# Patient Record
Sex: Male | Born: 1970 | Race: White | Hispanic: No | Marital: Married | Smoking: Never smoker
Health system: Southern US, Community
[De-identification: ages and names within clinical notes are randomized; demographics above are authoritative.]

## PROBLEM LIST (undated history)

## (undated) DIAGNOSIS — I10 Essential (primary) hypertension: Secondary | ICD-10-CM

## (undated) DIAGNOSIS — E785 Hyperlipidemia, unspecified: Secondary | ICD-10-CM

## (undated) DIAGNOSIS — K219 Gastro-esophageal reflux disease without esophagitis: Secondary | ICD-10-CM

## (undated) HISTORY — DX: Essential (primary) hypertension: I10

## (undated) HISTORY — PX: NASAL SEPTUM SURGERY: SHX37

## (undated) HISTORY — PX: FRACTURE SURGERY: SHX138

## (undated) HISTORY — PX: TONSILECTOMY, ADENOIDECTOMY, BILATERAL MYRINGOTOMY AND TUBES: SHX2538

## (undated) HISTORY — DX: Hyperlipidemia, unspecified: E78.5

## (undated) HISTORY — DX: Gastro-esophageal reflux disease without esophagitis: K21.9

---

## 2001-02-23 ENCOUNTER — Ambulatory Visit (HOSPITAL_BASED_OUTPATIENT_CLINIC_OR_DEPARTMENT_OTHER): Admission: RE | Admit: 2001-02-23 | Discharge: 2001-02-23 | Payer: Self-pay | Admitting: Otolaryngology

## 2002-07-28 ENCOUNTER — Emergency Department (HOSPITAL_COMMUNITY): Admission: EM | Admit: 2002-07-28 | Discharge: 2002-07-28 | Payer: Self-pay | Admitting: *Deleted

## 2002-07-28 ENCOUNTER — Encounter: Payer: Self-pay | Admitting: *Deleted

## 2003-01-24 ENCOUNTER — Emergency Department (HOSPITAL_COMMUNITY): Admission: EM | Admit: 2003-01-24 | Discharge: 2003-01-24 | Payer: Self-pay | Admitting: Emergency Medicine

## 2003-01-24 ENCOUNTER — Encounter: Payer: Self-pay | Admitting: Emergency Medicine

## 2003-01-27 ENCOUNTER — Ambulatory Visit (HOSPITAL_BASED_OUTPATIENT_CLINIC_OR_DEPARTMENT_OTHER): Admission: RE | Admit: 2003-01-27 | Discharge: 2003-01-28 | Payer: Self-pay | Admitting: Orthopedic Surgery

## 2003-03-25 ENCOUNTER — Encounter (HOSPITAL_COMMUNITY): Admission: RE | Admit: 2003-03-25 | Discharge: 2003-04-24 | Payer: Self-pay | Admitting: Orthopedic Surgery

## 2003-04-05 ENCOUNTER — Encounter: Payer: Self-pay | Admitting: Family Medicine

## 2003-04-05 ENCOUNTER — Ambulatory Visit (HOSPITAL_COMMUNITY): Admission: RE | Admit: 2003-04-05 | Discharge: 2003-04-05 | Payer: Self-pay | Admitting: Family Medicine

## 2003-04-26 ENCOUNTER — Encounter (HOSPITAL_COMMUNITY): Admission: RE | Admit: 2003-04-26 | Discharge: 2003-05-26 | Payer: Self-pay | Admitting: Orthopedic Surgery

## 2003-05-26 ENCOUNTER — Encounter (HOSPITAL_COMMUNITY): Admission: RE | Admit: 2003-05-26 | Discharge: 2003-06-25 | Payer: Self-pay | Admitting: Orthopedic Surgery

## 2003-06-28 ENCOUNTER — Encounter (HOSPITAL_COMMUNITY): Admission: RE | Admit: 2003-06-28 | Discharge: 2003-07-28 | Payer: Self-pay | Admitting: Orthopedic Surgery

## 2003-11-02 ENCOUNTER — Ambulatory Visit (HOSPITAL_COMMUNITY): Admission: RE | Admit: 2003-11-02 | Discharge: 2003-11-02 | Payer: Self-pay | Admitting: Orthopedic Surgery

## 2003-11-02 ENCOUNTER — Ambulatory Visit (HOSPITAL_BASED_OUTPATIENT_CLINIC_OR_DEPARTMENT_OTHER): Admission: RE | Admit: 2003-11-02 | Discharge: 2003-11-02 | Payer: Self-pay | Admitting: Orthopedic Surgery

## 2003-11-09 ENCOUNTER — Encounter (HOSPITAL_COMMUNITY): Admission: RE | Admit: 2003-11-09 | Discharge: 2003-12-09 | Payer: Self-pay | Admitting: Orthopedic Surgery

## 2003-12-12 ENCOUNTER — Encounter (HOSPITAL_COMMUNITY): Admission: RE | Admit: 2003-12-12 | Discharge: 2004-01-11 | Payer: Self-pay | Admitting: Orthopedic Surgery

## 2004-01-11 ENCOUNTER — Encounter (HOSPITAL_COMMUNITY): Admission: RE | Admit: 2004-01-11 | Discharge: 2004-02-10 | Payer: Self-pay | Admitting: Orthopedic Surgery

## 2007-06-12 ENCOUNTER — Observation Stay (HOSPITAL_COMMUNITY): Admission: RE | Admit: 2007-06-12 | Discharge: 2007-06-13 | Payer: Self-pay | Admitting: Otolaryngology

## 2007-06-12 ENCOUNTER — Encounter (INDEPENDENT_AMBULATORY_CARE_PROVIDER_SITE_OTHER): Payer: Self-pay | Admitting: Otolaryngology

## 2007-08-18 ENCOUNTER — Ambulatory Visit (HOSPITAL_BASED_OUTPATIENT_CLINIC_OR_DEPARTMENT_OTHER): Admission: RE | Admit: 2007-08-18 | Discharge: 2007-08-18 | Payer: Self-pay | Admitting: Otolaryngology

## 2007-08-22 ENCOUNTER — Ambulatory Visit: Payer: Self-pay | Admitting: Internal Medicine

## 2010-01-24 ENCOUNTER — Encounter: Admission: RE | Admit: 2010-01-24 | Discharge: 2010-01-24 | Payer: Self-pay | Admitting: Occupational Medicine

## 2010-11-06 NOTE — Procedures (Signed)
[name], Lawrence Hull NO.:  [id number]   MEDICAL RECORD NO.:  [id number]          PATIENT TYPE:  OUT   LOCATION:  SLEEP CENTER                 FACILITY:  [facility]   PHYSICIAN:  Clinton D. Maple Hudson, MD, FCCP, FACPDATE OF BIRTH:  02/25/1971   DATE OF STUDY:  08/18/2007                            NOCTURNAL POLYSOMNOGRAM   REFERRING PHYSICIAN:  Onalee Hua L. Annalee Genta, M.D.   INDICATION FOR STUDY:  Hypersomnia with sleep apnea.   EPWORTH SLEEPINESS SCORE:  4/24, BMI 42, weight 286 pounds, height 69  inches, neck 17 inches.   MEDICATIONS:  Home medication charted and reviewed.   SLEEP ARCHITECTURE:  Total sleep time 376 minutes with sleep efficiency  91.2%.  Stage I was 4.5%, stage II 74.6%, stage absent, REM 20.8% of  total sleep time.  Sleep latency 19 minutes REM latency 50 minutes,  awake after sleep onset 17.5 minutes, arousal index 22.  Bedtime  medication included Ambien 5 mg, Actos, Prilosec, Vytorin, atenolol.   RESPIRATORY DATA:  Apnea-hypopnea index (AHI) 3.7/hr which is normal.  Respiratory disturbance index (RDI) 5.1/hr, which would borderline  abnormal, with upper limit of normal 5/hr.  There were 23 events scored,  including 1 obstructive apnea and 22 hypopneas.  All events were  recorded while lying supine.  REM AHI 14.5.  There were insufficient  events to qualify for CPAP titration by split protocol on this study  night.   OXYGEN DATA:  No snoring.  Oxygen desaturation transiently to a nadir of  86% with mean oxygen saturation through the study 96.5% on room air.   CARDIAC DATA:  Sinus rhythm with occasional PVCs.   MOVEMENT-PARASOMNIA:  No significant movement disturbance, no bathroom  trips.   IMPRESSIONS-RECOMMENDATIONS:  Occasional respiratory events (apnea-  hypopnea index 3.7 per hour), within normal limits (normal range 0 to 5  per hour).  Events were mainly associated with supine sleep position.  Snoring was not noted.  Oxygen saturation  dipped transiently to 86% with  a mean of 96.5% through the study on room air.      Clinton D. Maple Hudson, MD, FCCP, FACP  Diplomate, Biomedical engineer of Sleep Medicine  Electronically Signed     CDY/MEDQ  D:  08/22/2007 15:52:14  T:  08/23/2007 17:20:41  Job:  [id number]

## 2010-11-06 NOTE — Op Note (Signed)
NAMEDESMEN, SCHOFFSTALL            ACCOUNT NO.:  [id number]   MEDICAL RECORD NO.:  [id number]          PATIENT TYPE:  AMB   LOCATION:  SDS                          FACILITY:  [facility]   PHYSICIAN:  Kinnie Scales. Annalee Genta, M.D.DATE OF BIRTH:  1971-04-12   DATE OF PROCEDURE:  06/12/2007  DATE OF DISCHARGE:                               OPERATIVE REPORT   PREOPERATIVE DIAGNOSES/INDICATIONS FOR SURGERY:  1. Severe obstructive sleep apnea.  2. Nasal septal deviation with airway obstruction.  3. Tonsillar hypertrophy.  4. Uvula palatal hypertrophy.  5. Inferior turbinate hypertrophy.   POSTOPERATIVE DIAGNOSES:  1. Severe obstructive sleep apnea.  2. Nasal septal deviation with airway obstruction.  3. Tonsillar hypertrophy.  4. Uvula palatal hypertrophy.  5. Inferior turbinate hypertrophy.   SURGICAL PROCEDURE:  1. Uvulopalatopharyngoplasty.  2. Nasal septoplasty.  3. Vitreous tonsillectomy.  4. Inferior turbinate reduction.   SURGEON:  Dr. Annalee Genta.   ANESTHESIA:  General endotracheal.   COMPLICATIONS:  None.   BLOOD LOSS:  Less than 100 mL.   The patient transferred from the operating room to recovery room in  stable condition.   BRIEF HISTORY:  The patient is a 40 year old white male who was referred  for evaluation of severe obstructive sleep apnea.  The patient was  evaluated in the office and found to have a severely deviated septum  with left-sided nasal airway obstruction, significant nasal turbinate  hypertrophy.  Oral cavity and oropharynx showed tonsillar hypertrophy  with significant thickening of the palate, uvula and posterior tonsillar  pillars.  Given the patient's history and examination, we discussed  various treatment options.  He had been using CPAP with limited  effectiveness because of nasal airway congestion and pressure symptoms  and was having difficulty tolerating CPAP on a long-term basis, and  thus, we discussed surgical treatment consisting of  septoplasty,  turbinate reduction, tonsillectomy, uvulopalatopharyngoplasty to improve  the patient's airway.  Risk, benefits and possible complications of the  surgical procedure were discussed in detail with the patient and his  wife, and they understood and concurred with our plan for surgery which  was scheduled under general anesthesia as an outpatient on June 12, 2007.   PROCEDURE:  The patient was brought to the operating room at [facility] OR and placed in a supine position on the operating table.  General endotracheal anesthesia was established without difficulty.  When the patient was adequately anesthetized, he was positioned on the  operating table and prepped and draped in a sterile fashion.  The  patient's nasal cavity was injected with 1% lidocaine 1:100,000 solution  epinephrine and was injected in a submucosal fashion on the nasal septum  and inferior turbinates bilaterally.  Total of 8 mL was injected.  The  patient's nose was then packed with Afrin-soaked cottonoid pledgets  which was left in place for approximately 10 minutes to allow for  vasoconstriction and hemostasis.   The patient's surgical procedure was begun with nasal septoplasty.  A  left anterior hemitransfixion incision was created, and a  mucoperichondrial flap was elevated from anterior to posterior on the  patient's  left-hand side.  Bony cartilaginous junction was crossed in  the midline, and mucoperiosteal flap was elevated on the right.  With  the bone cartilage exposed, significant septal spurring and bony excess  was removed using a 4-mm osteotome.  Anterior, dorsal and columellar  cartilage was not deviated, and this was left intact.  Mid septal  cartilage was removed, morselized and returned to the mucoperichondrial  pocket at the conclusion of the surgical procedure.  The  mucoperichondrial flaps were then reapproximated with a 4-0 gut suture  on a Keith needle in horizontal  mattress fashion.  At the end of the  surgical procedure, bilateral nasal septal splints were placed without  difficulty after the application of Bactroban ointment and sutured into  position with a 3-0 Ethilon suture.   Inferior turbinate reduction was then performed with bipolar cautery set  at 12 watts.  Two submucosal passes were made in each inferior  turbinate, when the turbinates had been adequately cauterized and  outfractured to create a more patent nasal cavity, small anterior  incisions were created in each turbinate and a small turbinate bone was  resected after mobilizing the overlying mucosa.  The patient's nasal  cavity was then suction and irrigated.  Turbinates were outfractured to  create maximal patency, and there was minimal bleeding.   Attention was then turned the oral cavity and oropharynx.  A Crowe-Davis  mouth gag was inserted without difficulty.  No loose or broken teeth.  Hard and soft palate were intact.  The patient had significant tonsillar  hypertrophy with bilateral cryptic tonsillar changes.  Using Bovie  electrocautery, the tonsils were removed, dissected in subcapsular  fashion, the entire left tonsil was removed from superior pole of the  base right.  Tonsil removed in a similar fashion, and tonsil tissue was  sent to pathology for gross and microscopic evaluation.  Tonsillar fossa  were gently abraded with a dry tonsil sponge, and several small areas of  point hemorrhage were then cauterized with suction cautery.  There was  no further bleeding.  Attention then was turned to the palate, and a  uvulopalatopharyngoplasty was then performed, using Bovie electrocautery  and dissecting along the anterior aspect of the palate at the level of  the anterior tonsillar pillars, mucosa was divided, and deep submucosa  and muscular layer was then dissected.  Approximately 1-cm strip of  tissue including the uvula was resected, and dissection was then carried   posteriorly, preserving the posterior palatal mucosa for reconstruction.  With palatal tissue resected, reconstruction was then undertaken using a  4-0 Vicryl suture on a tapered needle.  Horizontal mattress sutures were  placed, advanced posterior tonsillar pillars anteriorly and closed the  tonsillar fossa.  This was done bilaterally to reconstruct the palate,  and posterior palatal mucosa was then reflected anteriorly along the mid  aspect of the palate, and reconstruction was completed with interrupted  4-0 Vicryl suture.   The patient's oral cavity and oropharynx were then irrigated and  suctioned.  Orogastric tube was passed.  Stomach contents were  aspirated.  The mouth gag was released and reapplied.  There was no  active bleeding.  Mouth gag was then removed.  The patient was awakened  from his anesthetic.  He was extubated and was then transferred from the  operating room to the recovery in stable condition.  No complications.  Blood loss less than 100 mL.           ______________________________  Onalee Hua  Donnal Moat, M.D.     DLS/MEDQ  D:  16/03/9603  T:  06/13/2007  Job:  [id number]

## 2010-11-09 ENCOUNTER — Other Ambulatory Visit (INDEPENDENT_AMBULATORY_CARE_PROVIDER_SITE_OTHER): Payer: Self-pay | Admitting: Surgery

## 2010-11-09 NOTE — Op Note (Signed)
Lawrence Hull, Lawrence Hull                      ACCOUNT NO.:  [id number]   MEDICAL RECORD NO.:  [id number]                   PATIENT TYPE:  AMB   LOCATION:  DSC                                  FACILITY:  [facility]   PHYSICIAN:  Loreta Ave, M.D.              DATE OF BIRTH:  December 15, 1970   DATE OF PROCEDURE:  11/02/2003  DATE OF DISCHARGE:                                 OPERATIVE REPORT   PREOPERATIVE DIAGNOSES:  1. Status post fracture dislocation of left ankle.  2. Status post removal of syndesmosis screw.  3. Painful retained medial malleolar screws.  4. Synovitis of the ankle.   POSTOPERATIVE DIAGNOSES:  1. Status post fracture dislocation of left ankle.  2. Status post removal of syndesmosis body screw.  3. Painful retained medial malleolar screws.  4. Synovitis of the ankle.  5. Intra-articular synovitis as well as some focal grade 3 chondromalacia,     lateral talar dome.  6. Well healed fractures.   PROCEDURES:  1. Left ankle examination under anesthesia.  2. Removal of medial malleolar screws times two.  3. Arthroscopy with chondroplasty of talus.  4. Partial synovectomy.  5. Removal of anterior tibial spurs.   SURGEON:  Loreta Ave, M.D.   ASSISTANT:  Arlys John D. Petrarca, P.A.-C.   ANESTHESIA:  General.   ESTIMATED BLOOD LOSS:  Minimal.   TOURNIQUET TIME:  One hour.   SPECIMENS:  None.   CULTURES:  None.   COMPLICATIONS:  None.   DRESSING:  Soft compressive.   DESCRIPTION OF PROCEDURE:  The patient was brought to the operating room and  after adequate anesthesia had been obtained the left ankle was examined.  Good stability.  Reasonable plantar flexion.  Dorsiflexion only about 10  degrees without real bony impingement; just stiffness around the ankle.  The  syndesmosis was well reduced.  Tourniquet was applied, stirrup applied to  hold the leg, prepped and draped in the usual sterile fashion.  Exsanguinated with elevation of the Esmarch and the  tourniquet was inflated  to 350 mmHg.  Medial incision was opened.  The deltoid ligament was exposed.  It was split longitudinally over the two screws which were identified and  then backed out without difficulty.  Wound irrigated and the ligament closed  with Vicryl. The wound was closed with nylon.  Anterolateral and  anteromedial ankle portals avoiding neurovascular structures.  The  arthroscope was introduced, the ankle distended and the ankle inspected.  Moderate to marked amount of synovitis anteriorly all removed.  Although it  was not bony impingement, there was a fairly significant anterior tibial  spur that was completely removed and tapered smoothly with a shaver and a  high speed bur.  Ankle was thoroughly inspected.  After all adhesions and  the synovitis were removed, I got a good inspection of the ankle.  The  medial side was well healed and there were no significant changes there.  However, on the lateral talus under the syndesmosis there was some focal  grade 3 lesions with chondral flaps and fissuring.  Chondroplasty to a  stable surface.  Although thinned in that area there was still reasonable  articular cartilage remaining.  The under surface of the tibia, the  syndesmosis and both the medial and lateral gutters all otherwise looked  intact.  At completion I had a little bit of improved dorsiflexion  maintaining a stable ankle and I felt like I got a good debridement of the  ankle itself.  The instruments and fluid were removed.  The ankle portals  were injected with Marcaine and the portals were closed with nylon. A  sterile compressive dressing was applied.  The tourniquet was deflated and  removed. Anesthesia was reversed.  The patient was brought to the recovery  room.  He tolerated the surgery well with no complications.                                               Loreta Ave, M.D.    DFM/MEDQ  D:  11/03/2003  T:  11/03/2003  Job:  [id number]

## 2010-11-09 NOTE — Op Note (Signed)
NAMELOU, LOEWE                      ACCOUNT NO.:  [id number]   MEDICAL RECORD NO.:  [id number]                   PATIENT TYPE:  AMB   LOCATION:  DSC                                  FACILITY:  [facility]   PHYSICIAN:  Loreta Ave, M.D.              DATE OF BIRTH:  Jun 29, 1970   DATE OF PROCEDURE:  01/27/2003  DATE OF DISCHARGE:                                 OPERATIVE REPORT   PREOPERATIVE DIAGNOSIS:  Markedly displaced medial malleolus fracture with  syndesmotic injury, left ankle.   POSTOPERATIVE DIAGNOSIS:  Markedly displaced medial malleolus fracture with  syndesmotic injury, left ankle.   PROCEDURES:  1. Left ankle open reduction and internal fixation of medial malleolus     fracture with cannulated 4.0 screws x2.  2. Examination and then repair of syndesmosis with syndesmotic cannulated     4.0 screw.   All of these Synthes screws.   SURGEON:  Loreta Ave, M.D.   ASSISTANT:  Arlys John D. Petrarca, P.A.-C.   ANESTHESIA:  General.   ESTIMATED BLOOD LOSS:  Minimal.   TOURNIQUET TIME:  40 minutes.   SPECIMENS:  None.   CULTURES:  None.   COMPLICATIONS:  None.   DRESSING:  Soft compressive with bulky dressing and splint.   DESCRIPTION OF PROCEDURE:  Patient brought to the operating room and placed  on the operating table in supine position.  After adequate had been  obtained, both ankles examined.  Obviously significantly displaced medial  malleolus fracture on the left with instability.  Also the syndesmosis open  to external rotation view on the left compared to normal views on the right.  Tourniquet applied to the upper aspect of the left leg.  Prepped and draped  in the usual sterile fashion.  Exsanguinated with elevation and Esmarch,  tourniquet inflated to 350 mmHg.  Longitudinal slightly curved incision over  the medial malleolus.  Subperiosteal exposure of the fracture.  The ankle  itself was drained.  Oblique fracture through the medial  malleolus with a  little bit of comminution.  After thoroughly debriding the area, it was able  to be reduced anatomically.  Fixed with two cannulated 4.0 screws, which  were pre-drilled and tapped over guidewires with adequate positioning with  fluoroscopic guidance.  Excellent stable fixation with anatomic alignment.  Once that was completed, I re-examined the ankle with external rotation and  stress, and there continued to be significant instability of the  syndesmosis.  Therefore fixed the syndesmosis with a longitudinal incision  over the fibula at the level of the syndesmosis.  A guidewire placed across  the syndesmosis, measured, and then pre-drilled.  It was then fixed with a  44 mm partially-threaded lag screw through the fibula and into the tibia.  The foot was held dorsiflexed while the screw was tightened.  Nice, secure  fixation into anatomic position.  At completion I had solid fixation of all  fractures  and ligamentous injury.  Full passive motion.  No gapping at the  syndesmosis with stress.  The wounds were irrigated, closed with Vicryl and  staples.  Margins of the wound injected with Marcaine.  A sterile  compressive dressing and short-leg splint applied.  Anesthesia reversed.  Brought to the recovery room.  Tolerated the surgery well with no  complications.                                                Loreta Ave, M.D.    DFM/MEDQ  D:  01/27/2003  T:  01/28/2003  Job:  [id number]

## 2010-11-14 ENCOUNTER — Ambulatory Visit (HOSPITAL_COMMUNITY)
Admission: RE | Admit: 2010-11-14 | Discharge: 2010-11-14 | Disposition: A | Discharge status: Home / Self Care | Payer: Private Health Insurance - Indemnity | Source: Ambulatory Visit | Attending: Surgery | Admitting: Surgery

## 2010-11-14 DIAGNOSIS — E119 Type 2 diabetes mellitus without complications: Secondary | ICD-10-CM | POA: Insufficient documentation

## 2010-11-14 DIAGNOSIS — Z6841 Body Mass Index (BMI) 40.0 and over, adult: Secondary | ICD-10-CM | POA: Insufficient documentation

## 2010-11-14 DIAGNOSIS — G4733 Obstructive sleep apnea (adult) (pediatric): Secondary | ICD-10-CM | POA: Insufficient documentation

## 2010-11-14 DIAGNOSIS — E7800 Pure hypercholesterolemia, unspecified: Secondary | ICD-10-CM | POA: Insufficient documentation

## 2010-11-14 DIAGNOSIS — K7689 Other specified diseases of liver: Secondary | ICD-10-CM | POA: Insufficient documentation

## 2010-11-14 DIAGNOSIS — I10 Essential (primary) hypertension: Secondary | ICD-10-CM | POA: Insufficient documentation

## 2010-11-14 DIAGNOSIS — K219 Gastro-esophageal reflux disease without esophagitis: Secondary | ICD-10-CM | POA: Insufficient documentation

## 2010-11-26 ENCOUNTER — Ambulatory Visit (HOSPITAL_COMMUNITY)
Admission: RE | Admit: 2010-11-26 | Discharge: 2010-11-26 | Disposition: A | Discharge status: Home / Self Care | Payer: Managed Care, Other (non HMO) | Source: Ambulatory Visit | Attending: Surgery | Admitting: Surgery

## 2010-11-26 DIAGNOSIS — Z6841 Body Mass Index (BMI) 40.0 and over, adult: Secondary | ICD-10-CM | POA: Insufficient documentation

## 2010-11-26 DIAGNOSIS — Z01818 Encounter for other preprocedural examination: Secondary | ICD-10-CM | POA: Insufficient documentation

## 2010-12-11 ENCOUNTER — Encounter: Payer: Managed Care, Other (non HMO) | Attending: Surgery | Admitting: *Deleted

## 2010-12-11 DIAGNOSIS — Z713 Dietary counseling and surveillance: Secondary | ICD-10-CM | POA: Insufficient documentation

## 2010-12-11 DIAGNOSIS — Z01818 Encounter for other preprocedural examination: Secondary | ICD-10-CM | POA: Insufficient documentation

## 2011-01-10 ENCOUNTER — Ambulatory Visit: Payer: Managed Care, Other (non HMO)

## 2011-01-10 ENCOUNTER — Encounter: Payer: Managed Care, Other (non HMO) | Attending: Surgery | Admitting: *Deleted

## 2011-01-10 ENCOUNTER — Encounter: Payer: Self-pay | Admitting: *Deleted

## 2011-01-10 DIAGNOSIS — Z713 Dietary counseling and surveillance: Secondary | ICD-10-CM | POA: Insufficient documentation

## 2011-01-10 DIAGNOSIS — Z01818 Encounter for other preprocedural examination: Secondary | ICD-10-CM | POA: Insufficient documentation

## 2011-01-10 NOTE — Progress Notes (Signed)
  Pre-Operative Nutrition Class  Patient was seen on 01/10/11 for Pre-Operative Nutrition education at [facility].   Surgery date: 01/29/11 @ 12:15pm Surgery type: LAGB  Weight today: 294.2lb Weight change: 3.8 lbs lost Total weight lost: 3.8lbs total BMI: 45%  Samples given per MNT protocol: Bariatric Advantage Multivitamin Lot # [id number] Exp: 9/13  Bariatric Advantage Calcium Citrate Lot # [id number] Exp: 10/13  Celebrate Vitamins Multivitamin Lot # [id number] Exp: 4/13  Celebrate VitaminsCalcium Citrate Lot #[id number]  Exp: 4/13  The following the learning objective met by the patient during this course:   Identifies Pre-Op Dietary Goals and will begin 2 weeks pre-operatively   Identifies appropriate sources of fluids and proteins   States protein recommendations and appropriate sources pre and post-operatively  Identifies Post-Operative Dietary Goals and will follow for 2 weeks post-operatively  Identifies appropriate multivitamin and calcium sources post-operatively  Describes the need for physical activity post-operatively and will follow MD recommendations  States when to call healthcare provider regarding medication questions or post-operative complications  Follow-Up Plan: Patient will follow-up at [facility] 2 weeks post operatively for diet advancement per MD.

## 2011-01-22 ENCOUNTER — Other Ambulatory Visit (INDEPENDENT_AMBULATORY_CARE_PROVIDER_SITE_OTHER): Payer: Self-pay | Admitting: Surgery

## 2011-01-22 ENCOUNTER — Encounter (HOSPITAL_COMMUNITY): Payer: Managed Care, Other (non HMO)

## 2011-01-22 LAB — COMPREHENSIVE METABOLIC PANEL WITH GFR
ALT: 27 U/L (ref 0–53)
AST: 20 U/L (ref 0–37)
Albumin: 4.6 g/dL (ref 3.5–5.2)
Alkaline Phosphatase: 80 U/L (ref 39–117)
BUN: 25 mg/dL — ABNORMAL HIGH (ref 6–23)
CO2: 27 meq/L (ref 19–32)
Calcium: 10.2 mg/dL (ref 8.4–10.5)
Chloride: 94 meq/L — ABNORMAL LOW (ref 96–112)
Creatinine, Ser: 1.23 mg/dL (ref 0.50–1.35)
GFR calc Af Amer: >60 mL/min
GFR calc non Af Amer: >60 mL/min
Glucose, Bld: 103 mg/dL — ABNORMAL HIGH (ref 70–99)
Potassium: 4.4 meq/L (ref 3.5–5.1)
Sodium: 130 meq/L — ABNORMAL LOW (ref 135–145)
Total Bilirubin: 0.5 mg/dL (ref 0.3–1.2)
Total Protein: 7.6 g/dL (ref 6.0–8.3)

## 2011-01-22 LAB — CBC
Hemoglobin: 14.8 g/dL (ref 13.0–17.0)
MCHC: 33.9 g/dL (ref 30.0–36.0)
RDW: 12.5 % (ref 11.5–15.5)
WBC: 8.1 10*3/uL (ref 4.0–10.5)

## 2011-01-22 LAB — DIFFERENTIAL
Basophils Absolute: 0 10*3/uL (ref 0.0–0.1)
Basophils Relative: 0 % (ref 0–1)
Eosinophils Relative: 1 % (ref 0–5)
Lymphocytes Relative: 40 % (ref 12–46)
Monocytes Absolute: 0.9 10*3/uL (ref 0.1–1.0)
Neutro Abs: 3.9 10*3/uL (ref 1.7–7.7)

## 2011-01-22 LAB — SURGICAL PCR SCREEN
MRSA, PCR: NEGATIVE
Staphylococcus aureus: POSITIVE — AB

## 2011-01-23 ENCOUNTER — Encounter (INDEPENDENT_AMBULATORY_CARE_PROVIDER_SITE_OTHER): Payer: Self-pay | Admitting: General Surgery

## 2011-01-25 ENCOUNTER — Ambulatory Visit (INDEPENDENT_AMBULATORY_CARE_PROVIDER_SITE_OTHER): Payer: Managed Care, Other (non HMO) | Admitting: Surgery

## 2011-01-25 ENCOUNTER — Encounter (INDEPENDENT_AMBULATORY_CARE_PROVIDER_SITE_OTHER): Payer: Self-pay | Admitting: Surgery

## 2011-01-25 VITALS — BP 128/72 | HR 64 | Temp 97.6°F | Resp 16 | Ht 68.0 in | Wt 284.4 lb

## 2011-01-25 NOTE — Progress Notes (Signed)
Lawrence Hull and his wife Lawrence Hull on whom I have done a LAP-BAND come in today for a preop visit. Today's BMI is 43, weight is 284, height is 5 feet 8. He had an upper GI series that did show a small sliding hiatal hernia and he does have GER. I told him we would try to fix his hiatal hernia at the same time Lawrence Hull has some symptoms that she may be too tight. At times she will have nocturnal reflux.  Will get UGI on her.

## 2011-01-25 NOTE — Patient Instructions (Signed)
Stay on preop diet

## 2011-01-29 ENCOUNTER — Ambulatory Visit (HOSPITAL_COMMUNITY)
Admission: RE | Admit: 2011-01-29 | Discharge: 2011-01-30 | Disposition: A | Discharge status: Home / Self Care | Payer: Managed Care, Other (non HMO) | Source: Ambulatory Visit | Attending: Surgery | Admitting: Surgery

## 2011-01-29 DIAGNOSIS — Z6841 Body Mass Index (BMI) 40.0 and over, adult: Secondary | ICD-10-CM

## 2011-01-29 DIAGNOSIS — I10 Essential (primary) hypertension: Secondary | ICD-10-CM | POA: Insufficient documentation

## 2011-01-29 DIAGNOSIS — K219 Gastro-esophageal reflux disease without esophagitis: Secondary | ICD-10-CM | POA: Insufficient documentation

## 2011-01-29 DIAGNOSIS — Z01812 Encounter for preprocedural laboratory examination: Secondary | ICD-10-CM | POA: Insufficient documentation

## 2011-01-29 DIAGNOSIS — K449 Diaphragmatic hernia without obstruction or gangrene: Secondary | ICD-10-CM

## 2011-01-29 DIAGNOSIS — K2100 Gastro-esophageal reflux disease with esophagitis, without bleeding: Secondary | ICD-10-CM

## 2011-01-29 HISTORY — PX: LAPAROSCOPIC GASTRIC BANDING: SHX1100

## 2011-01-29 LAB — HEMOGLOBIN AND HEMATOCRIT, BLOOD: HCT: 40.2 % (ref 39.0–52.0)

## 2011-01-29 LAB — GLUCOSE, CAPILLARY
Glucose-Capillary: 119 mg/dL — ABNORMAL HIGH (ref 70–99)
Glucose-Capillary: 138 mg/dL — ABNORMAL HIGH (ref 70–99)

## 2011-01-30 ENCOUNTER — Ambulatory Visit (HOSPITAL_COMMUNITY): Payer: Managed Care, Other (non HMO)

## 2011-01-30 LAB — CBC
MCH: 30.7 pg (ref 26.0–34.0)
Platelets: 231 10*3/uL (ref 150–400)
RBC: 4.56 MIL/uL (ref 4.22–5.81)
RDW: 12.8 % (ref 11.5–15.5)

## 2011-01-30 LAB — DIFFERENTIAL
Basophils Relative: 0 % (ref 0–1)
Eosinophils Absolute: 0 10*3/uL (ref 0.0–0.7)
Eosinophils Relative: 0 % (ref 0–5)
Monocytes Relative: 11 % (ref 3–12)
Neutrophils Relative %: 66 % (ref 43–77)

## 2011-01-30 MED ORDER — IOHEXOL 300 MG/ML  SOLN
50.0000 mL | Freq: Once | INTRAMUSCULAR | Status: AC | PRN
  Administered 2011-01-30: 50 mL via INTRAVENOUS

## 2011-02-04 ENCOUNTER — Telehealth (INDEPENDENT_AMBULATORY_CARE_PROVIDER_SITE_OTHER): Payer: Self-pay | Admitting: General Surgery

## 2011-02-12 ENCOUNTER — Encounter: Payer: Managed Care, Other (non HMO) | Attending: Surgery | Admitting: *Deleted

## 2011-02-12 DIAGNOSIS — Z09 Encounter for follow-up examination after completed treatment for conditions other than malignant neoplasm: Secondary | ICD-10-CM | POA: Insufficient documentation

## 2011-02-12 DIAGNOSIS — Z9884 Bariatric surgery status: Secondary | ICD-10-CM | POA: Insufficient documentation

## 2011-02-12 DIAGNOSIS — Z713 Dietary counseling and surveillance: Secondary | ICD-10-CM | POA: Insufficient documentation

## 2011-02-12 NOTE — Progress Notes (Signed)
  2 Week Post-Operative Nutrition Class  Patient was seen on 02/12/2011 for Post-Operative Nutrition education at the [facility].   Surgery date: 01/29/11 Surgery type: LAGB  Weight today: 266.8 Weight change: 25.6lbs  Total weight lost: 29.4 lbs total BMI: 40.7  The following the learning objective met the patient during this course:   Identifies Phase 3A (Soft, High Proteins) Dietary Goals and will begin from 2 weeks post-operatively to 2 months post-operatively   Identifies appropriate sources of fluids and proteins   States protein recommendations and appropriate sources post-operatively  Identifies the need for appropriate texture modifications, mastication, and bite sizes when consuming solids  Identifies appropriate multivitamin and calcium sources post-operatively  Describes the need for physical activity post-operatively and will follow MD recommendations  States when to call healthcare provider regarding medication questions or post-operative complications  Follow-Up Plan: Patient will follow-up at [facility] in 6 weeks for 2 months post-op nutrition visit for diet advancement per MD.

## 2011-02-12 NOTE — Patient Instructions (Signed)
Patient to follow Phase 3A-Soft, High Protein Diet and follow-up at [facility] in 6 weeks for 2 months post-op nutrition visit for diet advancement. 

## 2011-02-14 ENCOUNTER — Ambulatory Visit (INDEPENDENT_AMBULATORY_CARE_PROVIDER_SITE_OTHER): Payer: Managed Care, Other (non HMO) | Admitting: Surgery

## 2011-02-14 DIAGNOSIS — Z9884 Bariatric surgery status: Secondary | ICD-10-CM

## 2011-02-14 NOTE — Progress Notes (Signed)
First postop visit 2 weeks out from a LAP-BAND. Weight is 265 screws lost almost 20 pounds. He is doing well. His incisions look good and all see him again in 4 weeks. At that time I will assess him and his wife both for band fills.

## 2011-02-27 NOTE — Op Note (Signed)
  [name], [name] NO.:  [id number]  MEDICAL RECORD NO.:  [id number]  LOCATION:  1538                         FACILITY:  [facility]  PHYSICIAN:  Thornton Park. [name], MD  DATE OF BIRTH:  09-30-1970  DATE OF PROCEDURE:  01/29/2011 DATE OF DISCHARGE:                              OPERATIVE REPORT   PREOPERATIVE DIAGNOSIS:  Morbid obesity, BMI of 45 with GERD.  PROCEDURE:  Laparoscopic posterior hiatal hernia repair with one suture, Lap-Band (Allergan APL system).  SURGEON:  Thornton Park. [name], MD  ASSISTANT:  Dr. Ezzard Standing.  ANESTHESIA:  General endotracheal.  DESCRIPTION OF PROCEDURE:  This is a 40 year old white male, was taken to room 1 on January 29, 2011, given general anesthesia.  The abdomen was prepped with PCMX and draped sterilely.  Time-out procedure was performed and we proceeded to the left upper quadrant with a 0-degree OptiView, entering the abdomen without difficulty.  The abdomen was insufflated.  Standard trocars placed for band were used, including the 15, placed obliquely in the right upper quadrant.  The patient's anatomy was very anterior and cephalad, and all port sites were adjusted upward accordingly.  Nathanson retractor was used to retract the left lateral segment of the liver.  Fogarty dissection was performed and the balloon test was done and showed that there was indeed a broadening of his hiatus.  I opened this and posteriorly went back, reducing the fat pad, but it would not exactly come down completely.  So I left it, found a left crus and a right crus, put a single stitch posteriorly.  This gave much more resistance on the balloon test.  We then placed an APL band, first placing the band passer along to the left crus and then inserting the APL band, threading it through the band passer, bringing it around engaging the buckle.  It was then plicated with three sutures using Surgidac, free needles.  An anti-slip stitch was also  placed.  Tubing was brought to the lower port on the right and then fixed to a port that had mesh on the back and tucked subcutaneously.  Port sites were all injected with Marcaine, closed with 4-0 Vicryl, and were closed with benzoin and Steri-Strips.  The patient tolerated the procedure well, was taken to recovery room in satisfactory condition.     Thornton Park [name], MD     MBM/MEDQ  D:  01/29/2011  T:  01/30/2011  Job:  [id number]  cc:   [name], MD Fax: [phone]  Electronically Signed by Luretha Murphy MD on 02/27/2011 01:33:32 PM

## 2011-02-27 NOTE — Discharge Summary (Signed)
  NAMEDERRAL, COLUCCI NO.:  [id number]  MEDICAL RECORD NO.:  [id number]  LOCATION:  1538                         FACILITY:  [facility]  PHYSICIAN:  Thornton Park. [name], MD  DATE OF BIRTH:  1970-08-04  DATE OF ADMISSION:  01/29/2011 DATE OF DISCHARGE:  01/30/2011                              DISCHARGE SUMMARY   ADMITTING DIAGNOSES:  Morbid obesity, gastroesophageal reflux disease.  PROCEDURE:  Laparoscopic adjustable gastric banding (Allergan APL system) and hiatal hernia repair.  One suture posteriorly and anti-slip stitch.  COURSE IN HOSPITAL:  Lawrence Hull is a 40 year old white male underwent the above-mentioned operation.  On postop day #1, his vital signs were stable.  He is doing well.  He was given a script for Roxicet Elixir to take for pain.  An upper GI series was pending and we anticipate discharge later today.  He will be on a liquid protein diet, instructions were given regarding that.CONDITION:  Improved.     Thornton Park [name], MD     MBM/MEDQ  D:  01/30/2011  T:  01/30/2011  Job:  [id number]  Electronically Signed by Luretha Murphy MD on 02/27/2011 01:33:27 PM

## 2011-03-15 ENCOUNTER — Encounter (INDEPENDENT_AMBULATORY_CARE_PROVIDER_SITE_OTHER): Payer: Self-pay | Admitting: Surgery

## 2011-03-15 ENCOUNTER — Ambulatory Visit (INDEPENDENT_AMBULATORY_CARE_PROVIDER_SITE_OTHER): Payer: Managed Care, Other (non HMO) | Admitting: Surgery

## 2011-03-15 VITALS — BP 136/94 | HR 76 | Temp 97.4°F | Resp 20 | Ht 68.0 in | Wt 271.2 lb

## 2011-03-15 DIAGNOSIS — Z9884 Bariatric surgery status: Secondary | ICD-10-CM

## 2011-03-15 NOTE — Progress Notes (Signed)
First band fill.  Added 3 cc to his band.   Dealing with his mother who has lung cancer that is very far advanced.   Return 6 weeks

## 2011-03-26 ENCOUNTER — Encounter: Payer: Managed Care, Other (non HMO) | Attending: Surgery | Admitting: *Deleted

## 2011-03-26 ENCOUNTER — Encounter: Payer: Self-pay | Admitting: *Deleted

## 2011-03-26 DIAGNOSIS — Z713 Dietary counseling and surveillance: Secondary | ICD-10-CM | POA: Insufficient documentation

## 2011-03-26 DIAGNOSIS — Z01818 Encounter for other preprocedural examination: Secondary | ICD-10-CM | POA: Insufficient documentation

## 2011-03-26 DIAGNOSIS — Z9884 Bariatric surgery status: Secondary | ICD-10-CM

## 2011-03-26 NOTE — Progress Notes (Signed)
  Follow-up visit: 8 Weeks Post-Operative LAGB Surgery  Medical Nutrition Therapy:  Appt start time: 1715 end time:  1745.  Assessment:  Primary concerns today: post-operative bariatric surgery nutrition management. Pt reports stressors in life related to the recent loss of his mother to lunch cancer. He had a band fill per Dr. [name] and has reported much better resistance and portion control. Per food recall patients portions continue to be large with consistent amount of carbohydrate consumed (bread, rice, buns, fries, etc).  Weight today: 269.8 lbs Weight change: 3 lbs gain Total weight lost: 26.4 lbs total BMI: 41.1 Weight goal: 150-170 lbs  24-hr recall:  B (7-8 AM): 1 biscuit OR Eggs (2), 2 oz ham Snk (9-10  AM): Nabs   L (12-1 PM): 2 tacos ([employer]) OR Lunchable OR "Beannie Weenies" Snk (PM): N/A  D (6 PM): 1 sandwich (2 pieces bread), deli meat, mayo w/ chips (2 oz) OR Hamburger (1) Snk (PM): N/A  Fluid intake: water, crystal light = 60 oz Estimated total protein intake: 80-90  Medications: Off Omeprazole; See updated list Supplementation: Irregular intake  CBG monitoring: Daily, fasting Average CBG per patient: 105-130 (Fasting) Last patient reported A1c: No recent  No results found for this basename: HGBA1C   Using straws: No Drinking while eating: No Hair loss: No Carbonated beverages: Crush (orange) 2 per day N/V/D/C: No Last Lap-Band fill: Pt has had one 3cc band  Recent physical activity:  Very limited due to stress and recent loss of mother  Progress Towards Goal(s):  In progress.  Handouts given during visit include:  Phase 3B - High Protein and Non-Starchy Vegetables   Nutritional Diagnosis:  [place]-3.3 Overweight/obesity As related to recent LAGB surgery.  As evidenced by pt following LAGB guideslines for continued weight loss.    Intervention:  Nutrition education.  Monitoring/Evaluation:  Dietary intake, exercise, lap band fills, and body weight.  Follow up in 1-2 months for 3-4 month post-op visit.

## 2011-03-26 NOTE — Patient Instructions (Signed)
Goals:  Follow Phase 3B: High Protein + Non-Starchy Vegetables  Eat 3-6 small meals/snacks, every 3-5 hrs  D/C concentrated sweets and beverages  Increase fluid intake to 64oz +  Add 15 grams of carbohydrate (fruit, whole grain, starchy vegetable) with meals  Avoid drinking 15 minutes before, during and 30 minutes after eating  Aim for >30 min of physical activity daily

## 2011-03-29 LAB — CBC
Platelets: 265
RBC: 4.8
WBC: 6.8

## 2011-03-29 LAB — BASIC METABOLIC PANEL
BUN: 12
Calcium: 9.9
Chloride: 104
Creatinine, Ser: 1.05
GFR calc Af Amer: >60
GFR calc non Af Amer: >60

## 2011-05-02 ENCOUNTER — Ambulatory Visit (INDEPENDENT_AMBULATORY_CARE_PROVIDER_SITE_OTHER): Payer: Managed Care, Other (non HMO) | Admitting: Surgery

## 2011-05-02 ENCOUNTER — Encounter (INDEPENDENT_AMBULATORY_CARE_PROVIDER_SITE_OTHER): Payer: Self-pay | Admitting: Surgery

## 2011-05-02 DIAGNOSIS — E119 Type 2 diabetes mellitus without complications: Secondary | ICD-10-CM | POA: Insufficient documentation

## 2011-05-02 NOTE — Progress Notes (Signed)
Lawrence Hull 40 y.o.  Body mass index is 40.07 kg/(m^2).  Patient Active Problem List  Diagnoses  . Hx of laparoscopic gastric banding  . Diabetes mellitus    No Known Allergies  Past Surgical History  Procedure Date  . Laparoscopic gastric banding 01/29/11   [name] A, MD, MD 1. Diabetes mellitus     We added one cc  to his band and today. His weight is down to 263.8. We'll see him back in 6 weeks.  His diabetes is much better controlled. Plan return visit in 6 weeks [name], MD, [facility] [phone] beeper [phone]  05/02/2011 1:18 PM

## 2011-05-02 NOTE — Patient Instructions (Addendum)

## 2011-05-08 ENCOUNTER — Encounter: Payer: Managed Care, Other (non HMO) | Attending: Surgery | Admitting: *Deleted

## 2011-05-08 ENCOUNTER — Encounter: Payer: Self-pay | Admitting: *Deleted

## 2011-05-08 DIAGNOSIS — Z713 Dietary counseling and surveillance: Secondary | ICD-10-CM | POA: Insufficient documentation

## 2011-05-08 DIAGNOSIS — Z01818 Encounter for other preprocedural examination: Secondary | ICD-10-CM | POA: Insufficient documentation

## 2011-05-08 DIAGNOSIS — Z9884 Bariatric surgery status: Secondary | ICD-10-CM

## 2011-05-08 NOTE — Progress Notes (Signed)
  Follow-up visit: 12-14 Weeks Post-Operative LAGB Surgery  Medical Nutrition Therapy:  Appt start time: 1630 end time:  1655.  Assessment:  Primary concerns today: post-operative bariatric surgery nutrition management. Lawrence Hull reports that with his most recent fill has helped with portion control. He continues to eat a relatively large amount of carbohydrate with meals (pasta, fruits, corn, potatoes, breaded and fried meats). It appears that carbohydrate intake is much higher than protein intake. Portions continue to be larger yet may improve with his next fill.  Weight today: 256.8 lbs Weight change: 13 lbs since last visit Total weight lost: 39.4 lbs total BMI: 39.1% Weight goal: 150-170 lbs  Surgery date: 01/29/11 Start weight at [facility]: 296.8 lbs  24-hr recall:  B (AM): Protein shake Snk (AM): banana (large)   L (PM): Spaghetti w/ meat sauce OR Lunchable w/ 6 crackers Snk (PM): Protein bar  D (PM): 3 chicken strips, 1 cup corn, 1/2 mashed potatoes (or mac/cheese) Snk (PM): sugar-free pudding (1)  Fluid intake: 64 oz Estimated total protein intake: 50-60g  Medications: No changes; See updated medication list Supplementation: Pt is not taking supplements regularly  CBG monitoring: Daily Average CBG per patient: 100-120's Last patient reported A1c: 5.7%  No results found for this basename: HGBA1C   Using straws: No Drinking while eating: No Hair loss: No Carbonated beverages: No N/V/D/C: No Last Lap-Band fill: Most recent fill last week with Dr. [name]  Recent physical activity:  No structure exercise; Reports increased ADL's  Progress Towards Goal(s):  In progress.  Nutritional Diagnosis:  [place]-3.3 Overweight/obesity As related to recent LAGB surgery.  As evidenced by pt following LAGB nutrition guidelines for continued weight loss.    Intervention:  Nutrition education.  Monitoring/Evaluation:  Dietary intake, exercise, lap band fills, and body weight. Follow up in 3  months for 6 month post-op visit.

## 2011-05-08 NOTE — Patient Instructions (Signed)
Goals:  Follow Phase 3B: High Protein + Non-Starchy Vegetables  Eat 3-6 small meals/snacks, every 3-5 hrs  Increase lean protein foods to meet 80-90g goal  Always eat protein first!!  Increase fluid intake to 64oz +  Consume <1/2 cup portions of carbohydrate per meal   Avoid drinking 15 minutes before, during and 30 minutes after eating  Aim for >30 min of physical activity daily

## 2011-06-12 ENCOUNTER — Encounter (INDEPENDENT_AMBULATORY_CARE_PROVIDER_SITE_OTHER): Payer: Managed Care, Other (non HMO) | Admitting: Surgery

## 2011-06-14 ENCOUNTER — Encounter (INDEPENDENT_AMBULATORY_CARE_PROVIDER_SITE_OTHER): Payer: Managed Care, Other (non HMO) | Admitting: Surgery

## 2011-06-21 ENCOUNTER — Encounter (INDEPENDENT_AMBULATORY_CARE_PROVIDER_SITE_OTHER): Payer: Self-pay | Admitting: Surgery

## 2011-06-21 ENCOUNTER — Ambulatory Visit (INDEPENDENT_AMBULATORY_CARE_PROVIDER_SITE_OTHER): Payer: Managed Care, Other (non HMO) | Admitting: Surgery

## 2011-06-21 VITALS — BP 130/88 | HR 68 | Temp 97.5°F | Resp 18 | Ht 68.0 in | Wt 253.6 lb

## 2011-06-21 DIAGNOSIS — Z9884 Bariatric surgery status: Secondary | ICD-10-CM

## 2011-06-21 NOTE — Patient Instructions (Signed)

## 2011-06-21 NOTE — Progress Notes (Signed)
Lawrence Hull Body mass index is 38.56 kg/(m^2).  Having regurgitation: no   Nocturnal reflux?  no  Amount of fill  0.5

## 2011-07-30 NOTE — Telephone Encounter (Signed)
See routing comment

## 2011-08-03 IMAGING — CR DG CHEST 2V
2 series · 2 of 2 positions shown · non-contrast
Comparison: 06/09/2007

CLINICAL DATA: Morbid obesity, bariatric screening, hypertension,
diabetes

CHEST - 2 VIEW

[view not recorded (1 of 2)]
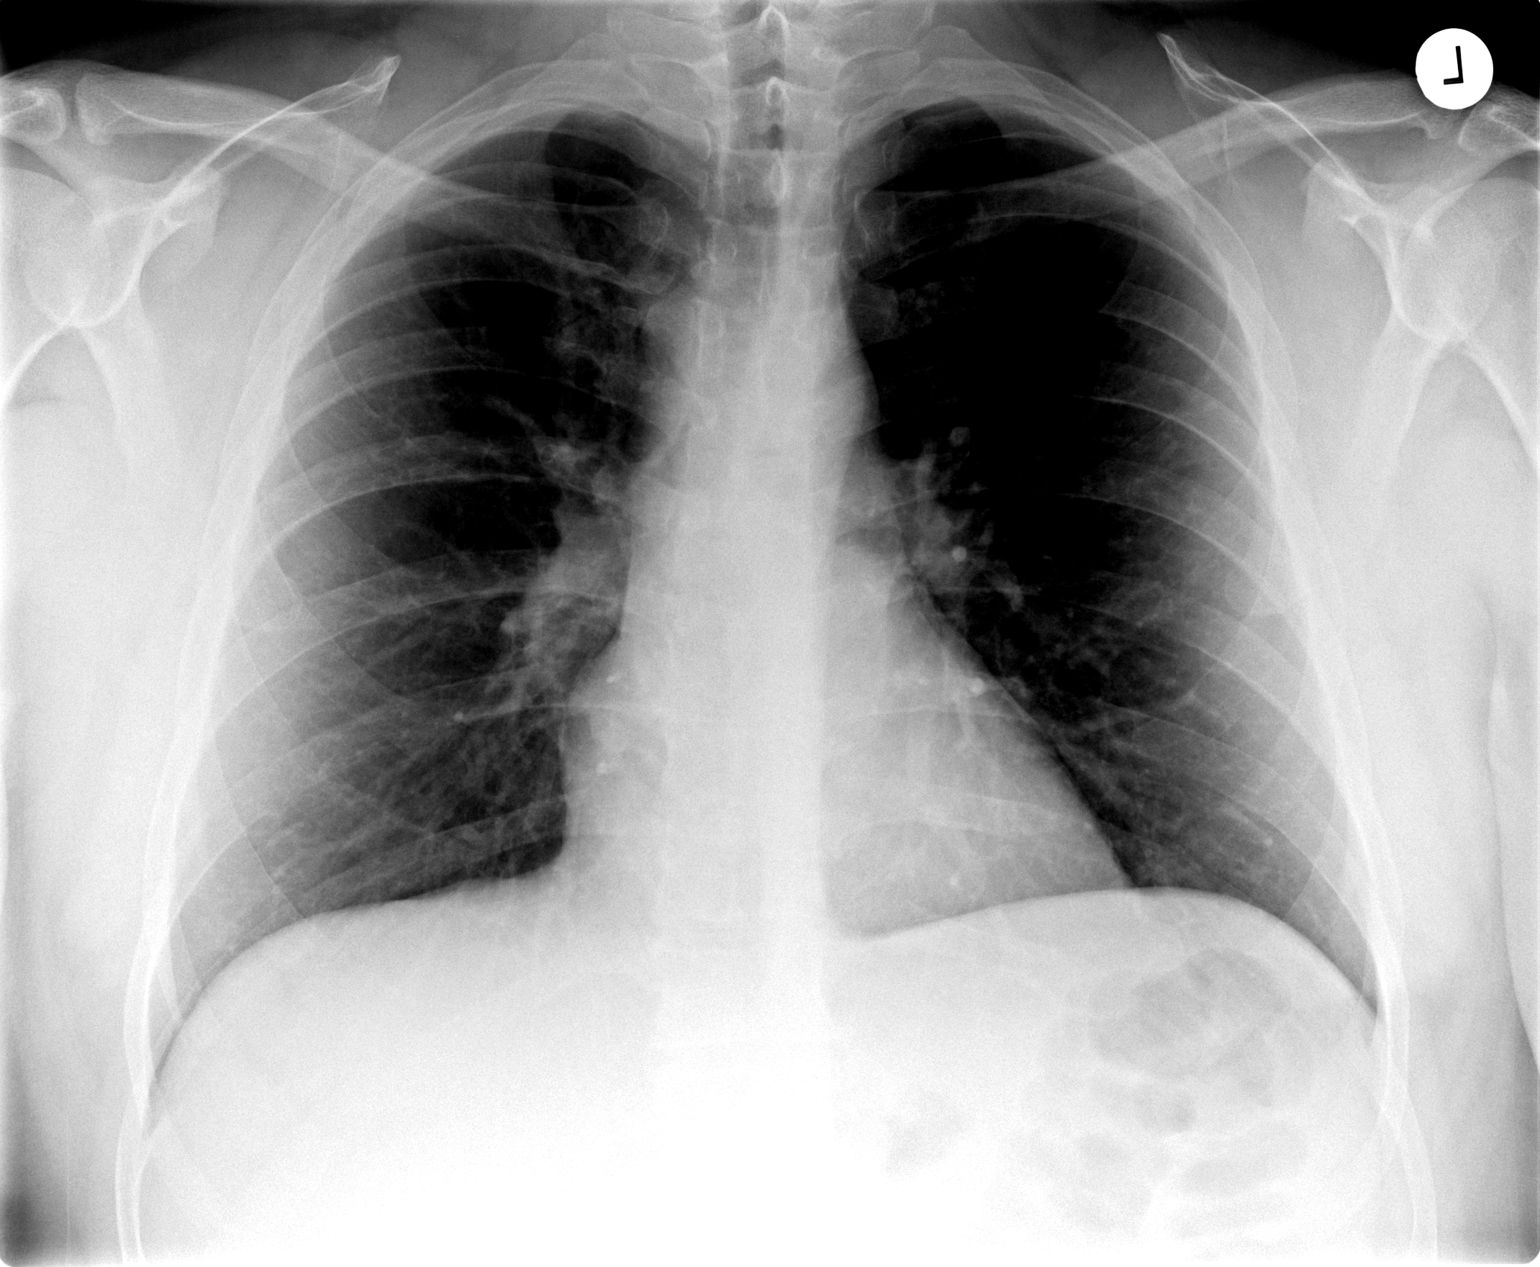

[view not recorded (2 of 2)]
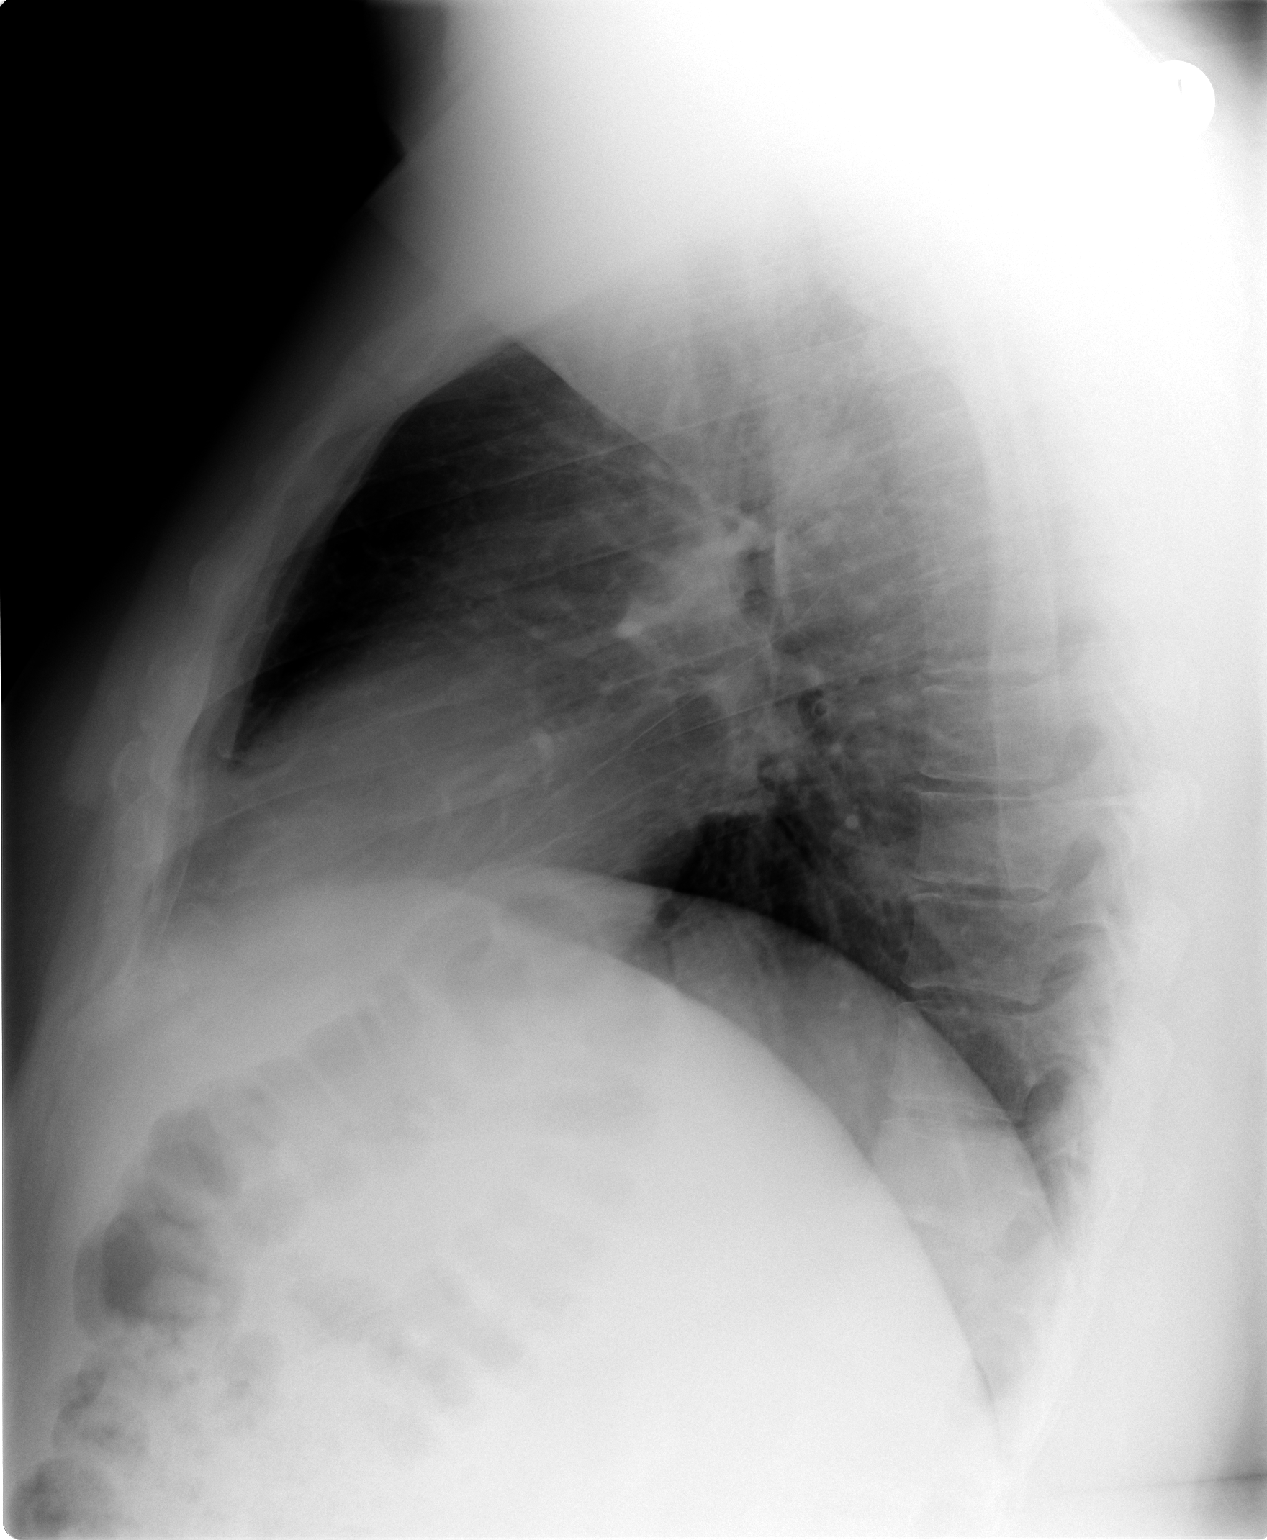

[2 of 2 positions shown; findings below may reference images not displayed]

FINDINGS: Normal heart size and vascularity.  Negative for
pneumonia, edema, effusion or pneumothorax.  Midline trachea.
Stable exam.
IMPRESSION: No acute chest disease.

## 2011-08-07 ENCOUNTER — Encounter: Payer: Managed Care, Other (non HMO) | Attending: Surgery | Admitting: *Deleted

## 2011-08-07 ENCOUNTER — Other Ambulatory Visit: Payer: Self-pay | Admitting: Occupational Medicine

## 2011-08-07 ENCOUNTER — Ambulatory Visit: Payer: Self-pay

## 2011-08-07 ENCOUNTER — Encounter: Payer: Self-pay | Admitting: *Deleted

## 2011-08-07 DIAGNOSIS — Z9884 Bariatric surgery status: Secondary | ICD-10-CM

## 2011-08-07 DIAGNOSIS — Z713 Dietary counseling and surveillance: Secondary | ICD-10-CM | POA: Insufficient documentation

## 2011-08-07 DIAGNOSIS — Z01818 Encounter for other preprocedural examination: Secondary | ICD-10-CM | POA: Insufficient documentation

## 2011-08-07 DIAGNOSIS — Z0000 Encounter for general adult medical examination without abnormal findings: Secondary | ICD-10-CM

## 2011-08-07 NOTE — Patient Instructions (Addendum)
Goals:  Follow Phase 3B: High Protein + Non-Starchy Vegetables             -Adhere to high protein/low-carbohydrate diet            -Limit simple carbohydrates/snack foods Eat 3-6 small meals/snacks, every 3-5 hrs  Increase lean protein foods to meet 80-90g goal  Always eat protein first!!  Increase fluid intake to 64oz +  Consume <1/2 cup portions of carbohydrate per meal  Avoid drinking 15 minutes before, during and 30 minutes after eating  Aim for >30 min of physical activity daily    

## 2011-08-07 NOTE — Progress Notes (Signed)
  Follow-up visit: 6 Month Post-Operative LAGB Surgery  Medical Nutrition Therapy:  Appt start time: 1623 end time:  1653.  Assessment:  Primary concerns today: post-operative bariatric surgery nutrition management. Pt reports that he feels that his eating behaviors are triggering regurgitation as he is aware that he eats too fast and does not chew very well. He notes that the first 3 bites of food "get stuck". Pt reports that after that he is able to eat, what he feels like, a relatively large portion of food. He is to see Dr. [name] on Friday and hopes to get another small band fill. Pt continues to eat potatoes, bread, pasta, rice, chips, fruit regularly yet notes that his portions are cut (at least) by 1/2. Pt had labs done at his job and all seem to be improving; glucose levels continue to be slightly elevated.  Weight today: 246.3 lbs  Weight change: 10.5 lbs since last visit Total weight lost: 49.9 lbs total BMI: 37.5% Weight goal: 165-170 lbs  Surgery date: 01/29/11 Start weight at [facility]: 296.8 lbs  24-hr recall:  B (AM): Protein shake Snk (AM): banana (large)   L (PM): Spaghetti w/ meat sauce OR Lunchable w/ 6 crackers Snk (PM): Protein bar  D (PM): 2-3 oz steak, 1 cup salad OR Taco meat, cheese, 10 nacho chips OR Hamburger steak (2oz), 1/2 cup mashed potatoes Snk (PM): sugar-free pudding (1) OR Popcorn  Fluid intake: Water, Protein shake, Unsweetened Tea = 64 oz + Estimated total protein intake: 50-60g  Medications: No changes; See updated medication list Supplementation: Pt taking MVI regularly  CBG monitoring: Daily Average CBG per patient: 100-120's Last patient reported A1c: 5.7% on 04/2011 (no recent A1c taken; to see MD in one month)  No results found for this basename: HGBA1C   Using straws: No Drinking while eating: No Hair loss: No Carbonated beverages: No N/V/D/C: Regurgitation when eating too fast/not chewing well Last Lap-Band fill: Most recent fill 12/28  with Dr. [name]  Recent physical activity:  No structure exercise; Reports increased ADL's at work and notes that he is able to take the stairs 5+ times per day. *Pt plans to join the [employer].  Progress Towards Goal(s):  In progress.  Nutritional Diagnosis:  [place]-3.3 Overweight/obesity As related to recent LAGB surgery.  As evidenced by pt following LAGB nutrition guidelines for continued weight loss.    Intervention:  Nutrition education.  Monitoring/Evaluation:  Dietary intake, exercise, lap band fills, and body weight. Follow up in 3-6 months for 9-12 month post-op visit.

## 2011-08-09 ENCOUNTER — Encounter (INDEPENDENT_AMBULATORY_CARE_PROVIDER_SITE_OTHER): Payer: Self-pay | Admitting: Surgery

## 2011-08-09 ENCOUNTER — Ambulatory Visit (INDEPENDENT_AMBULATORY_CARE_PROVIDER_SITE_OTHER): Payer: Managed Care, Other (non HMO) | Admitting: Surgery

## 2011-08-09 VITALS — BP 132/86 | HR 68 | Temp 97.9°F | Resp 18 | Ht 68.0 in | Wt 245.6 lb

## 2011-08-09 DIAGNOSIS — Z9884 Bariatric surgery status: Secondary | ICD-10-CM

## 2011-08-09 DIAGNOSIS — Z4651 Encounter for fitting and adjustment of gastric lap band: Secondary | ICD-10-CM

## 2011-08-09 NOTE — Progress Notes (Signed)
Lawrence Hull Body mass index is 37.34 kg/(m^2).  Having regurgitation:  no  Nocturnal reflux?  no  Amount of fill  0.5  Doing very well.  Restricted and in the green zone. Return 2 months

## 2011-10-04 ENCOUNTER — Ambulatory Visit (INDEPENDENT_AMBULATORY_CARE_PROVIDER_SITE_OTHER): Payer: Managed Care, Other (non HMO) | Admitting: Surgery

## 2011-10-04 ENCOUNTER — Encounter (INDEPENDENT_AMBULATORY_CARE_PROVIDER_SITE_OTHER): Payer: Self-pay | Admitting: Surgery

## 2011-10-04 VITALS — BP 124/75 | Temp 97.7°F | Ht 68.0 in | Wt 239.4 lb

## 2011-10-04 DIAGNOSIS — Z4651 Encounter for fitting and adjustment of gastric lap band: Secondary | ICD-10-CM

## 2011-10-04 DIAGNOSIS — Z9884 Bariatric surgery status: Secondary | ICD-10-CM

## 2011-10-04 NOTE — Progress Notes (Signed)
Lawrence Hull is 248 days postop or 8.2 months and has lost 60 pounds. As 40.5% of his excess body weight. Today I added half cc to his band. He has no reflux and takes no medicines since his hiatal hernia repair and APL band placement.

## 2011-12-06 ENCOUNTER — Encounter (INDEPENDENT_AMBULATORY_CARE_PROVIDER_SITE_OTHER): Payer: Self-pay | Admitting: Surgery

## 2011-12-06 ENCOUNTER — Ambulatory Visit (INDEPENDENT_AMBULATORY_CARE_PROVIDER_SITE_OTHER): Payer: Managed Care, Other (non HMO) | Admitting: Surgery

## 2011-12-06 VITALS — Ht 68.0 in | Wt 234.4 lb

## 2011-12-06 DIAGNOSIS — Z9884 Bariatric surgery status: Secondary | ICD-10-CM

## 2011-12-06 NOTE — Patient Instructions (Addendum)
Follow up in 6-8 weeks

## 2011-12-06 NOTE — Progress Notes (Signed)
Lawrence Hull Body mass index is 35.64 kg/(m^2).  Having regurgitation:  no  Nocturnal reflux?  no  Amount of fill  No fill  Lawrence Hull just had his medicine workup at work. His blood pressure was normal, his glucose was 90, his lipids were in the normal range and he is related that he is now wearing of size 42 pants. He was on a fence about having a fell today but clearly he is in agreement. This is restricting what he eats. Sometimes he doesn't make her choices as for lunch he eats Lunchables and and [employer] .    Overall I think he's making better choices he looks good. None is seen back in 6-8 weeks we'll consider filling him then.

## 2012-01-29 ENCOUNTER — Encounter: Payer: Self-pay | Admitting: *Deleted

## 2012-01-29 ENCOUNTER — Encounter: Payer: Managed Care, Other (non HMO) | Attending: Surgery | Admitting: *Deleted

## 2012-01-29 DIAGNOSIS — Z09 Encounter for follow-up examination after completed treatment for conditions other than malignant neoplasm: Secondary | ICD-10-CM | POA: Insufficient documentation

## 2012-01-29 DIAGNOSIS — Z713 Dietary counseling and surveillance: Secondary | ICD-10-CM | POA: Insufficient documentation

## 2012-01-29 DIAGNOSIS — Z9884 Bariatric surgery status: Secondary | ICD-10-CM | POA: Insufficient documentation

## 2012-01-29 NOTE — Progress Notes (Signed)
  Follow-up visit: 12 Month Post-Operative LAGB Surgery  Medical Nutrition Therapy:  Appt start time: 1600 end time:  1635.  Primary concerns today: post-operative bariatric surgery nutrition management.   Surgery date: 01/29/11 Start weight at [facility]: 296.8 lbs  Weight today: 228.5 lbs  Weight change: 17.8 lbs since last visit Total weight lost: 67.7 lbs total BMI: 34.7 kg/m^2  Weight goal: 165-170 lbs % goal met: 53%  24-hr recall: B (AM): Protein shake (SlimFast - 20g); Hot choc (6 oz); 20 oz water Snk (9-10 AM): 20 oz water; Waits 30 min - Pure Protein (20g) L (PM): Ham sandwich, fruit OR beanie weenies; OR hamburger helper, vegetable Snk (PM): 20 oz water Snk (PM): Protein bar  D (PM): 6" subway sub Snk (PM): sugar-free pudding (1) OR Popcorn  Fluid intake: Water, protein shake, SF hot choc, unsweetened Tea = >64 oz Estimated total protein intake: 80-95g  Medications: See updated medication list; Lipitor decreased from 20 mg to 10 mg. Supplementation: Pt taking MVI regularly; no calcium at this time  CBG monitoring: Not checking Last CBG per patient: 96 mg/dL (2hr PP) @ MD Last patient reported A1c: 5.7% in 04/2011 and 5.7% in 10/2011   Using straws: No Drinking while eating: No Hair loss: No Carbonated beverages: Sometimes - rare N/V/D/C: Regurgitation when eating too fast/not chewing well Last Lap-Band fill: Most recent fill 4/12 with Dr. [name]; feels he needs a "small fill" at next visit on 8/23  Recent physical activity:  Joined gym with wife and walks 1 mile on treadmill and 1 mile on track; 30-45 min total - also walks up and down stairs all day at work  Progress Towards Goal(s):  In progress.  Nutritional Diagnosis:  [place]-3.3 Overweight/obesity As related to recent LAGB surgery.  As evidenced by pt following LAGB nutrition guidelines for continued weight loss.    Intervention:  Nutrition education.  Monitoring/Evaluation:  Dietary intake, exercise, lap band  fills, and body weight. Follow up in 6 months for 18 month post-op visit.

## 2012-01-29 NOTE — Patient Instructions (Addendum)
Goals:  Follow Phase 3B: High Protein + Non-Starchy Vegetables             -Adhere to high protein/low-carbohydrate diet            -Limit simple carbohydrates/snack foods Eat 3-6 small meals/snacks, every 3-5 hrs  Increase lean protein foods to meet 80-90g goal  Always eat protein first!!  Increase fluid intake to 64oz +  Consume <1/2 cup portions of carbohydrate per meal  Avoid drinking 15 minutes before, during and 30 minutes after eating  Aim for >30 min of physical activity daily

## 2012-02-14 ENCOUNTER — Encounter (INDEPENDENT_AMBULATORY_CARE_PROVIDER_SITE_OTHER): Payer: Self-pay | Admitting: Surgery

## 2012-02-14 ENCOUNTER — Ambulatory Visit (INDEPENDENT_AMBULATORY_CARE_PROVIDER_SITE_OTHER): Payer: Managed Care, Other (non HMO) | Admitting: Surgery

## 2012-02-14 VITALS — BP 136/88 | HR 70 | Temp 97.8°F | Resp 16 | Ht 68.0 in | Wt 226.2 lb

## 2012-02-14 DIAGNOSIS — Z9884 Bariatric surgery status: Secondary | ICD-10-CM

## 2012-02-14 NOTE — Patient Instructions (Signed)

## 2012-02-14 NOTE — Progress Notes (Signed)
Lawrence Hull 41 y.o.  Body mass index is 34.39 kg/(m^2).  Patient Active Problem List  Diagnosis  . Lapband APL + HH repair August 2012  . Diabetes mellitus    No Known Allergies  Past Surgical History  Procedure Date  . Laparoscopic gastric banding 01/29/11  . Fracture surgery     left x2  . Nasal septum surgery   . Tonsilectomy, adenoidectomy, bilateral myringotomy and tubes    KHAN,JABER A, MD No diagnosis found.  Doing well.  Has lost 8.4 lbs since last fill.  Total 58 lbs.  I also saw his wife at the same time and removed 0.25 from her band because of nocturnal coughing and reflux [name], MD, [facility] [phone] beeper [phone]  02/14/2012 6:05 PM

## 2012-04-07 ENCOUNTER — Encounter (INDEPENDENT_AMBULATORY_CARE_PROVIDER_SITE_OTHER): Payer: Self-pay | Admitting: General Surgery

## 2012-04-08 ENCOUNTER — Encounter (INDEPENDENT_AMBULATORY_CARE_PROVIDER_SITE_OTHER): Payer: Self-pay | Admitting: Surgery

## 2012-04-08 ENCOUNTER — Ambulatory Visit (INDEPENDENT_AMBULATORY_CARE_PROVIDER_SITE_OTHER): Payer: Managed Care, Other (non HMO) | Admitting: Surgery

## 2012-04-08 ENCOUNTER — Encounter (INDEPENDENT_AMBULATORY_CARE_PROVIDER_SITE_OTHER): Payer: Managed Care, Other (non HMO) | Admitting: Surgery

## 2012-04-08 VITALS — BP 128/68 | HR 70 | Temp 97.6°F | Resp 16 | Ht 68.0 in | Wt 215.5 lb

## 2012-04-08 DIAGNOSIS — Z9884 Bariatric surgery status: Secondary | ICD-10-CM

## 2012-04-08 NOTE — Progress Notes (Signed)
Remi Haggard Body mass index is 32.77 kg/(m^2).  Having regurgitation:  Yes but it is better  Nocturnal reflux?  Sometimes.better  Amount of fill  0 Was probably too tight last time but now is much better.  Offered to remove fluid.  He wants to give it another 6 weeks. Down to 215

## 2012-04-08 NOTE — Patient Instructions (Signed)
Thanks for your patience.  If you need further assistance after leaving the office, please call our office and speak with a [facility] nurse.  [phone].  If you want to leave a message for Dr. Hernan Turnage, please call his office phone at [phone]. 

## 2012-06-05 ENCOUNTER — Ambulatory Visit (INDEPENDENT_AMBULATORY_CARE_PROVIDER_SITE_OTHER): Payer: Managed Care, Other (non HMO) | Admitting: Surgery

## 2012-06-05 ENCOUNTER — Encounter (INDEPENDENT_AMBULATORY_CARE_PROVIDER_SITE_OTHER): Payer: Self-pay | Admitting: Surgery

## 2012-06-05 VITALS — BP 132/90 | HR 60 | Temp 99.0°F | Resp 14 | Ht 68.0 in | Wt 207.8 lb

## 2012-06-05 DIAGNOSIS — Z9884 Bariatric surgery status: Secondary | ICD-10-CM

## 2012-06-05 NOTE — Progress Notes (Signed)
REINHARD SCHACK 41 y.o.  Body mass index is 31.60 kg/(m^2).  Patient Active Problem List  Diagnosis  . Lapband APL + HH repair August 2012  . Diabetes mellitus    No Known Allergies  Past Surgical History  Procedure Date  . Laparoscopic gastric banding 01/29/11  . Fracture surgery     left x2  . Nasal septum surgery   . Tonsilectomy, adenoidectomy, bilateral myringotomy and tubes    KHAN,JABER A, MD No diagnosis found. Has lost weight and is not having problems with being too tight.  Will continue to observe. Return 2 months.  [name], MD, [facility] [phone] beeper [phone]  06/05/2012 4:53 PM

## 2012-06-05 NOTE — Patient Instructions (Signed)

## 2012-08-03 ENCOUNTER — Ambulatory Visit: Payer: Self-pay | Admitting: *Deleted

## 2012-08-05 ENCOUNTER — Telehealth (INDEPENDENT_AMBULATORY_CARE_PROVIDER_SITE_OTHER): Payer: Self-pay

## 2012-08-05 ENCOUNTER — Telehealth (INDEPENDENT_AMBULATORY_CARE_PROVIDER_SITE_OTHER): Payer: Self-pay | Admitting: General Surgery

## 2012-08-05 ENCOUNTER — Encounter (INDEPENDENT_AMBULATORY_CARE_PROVIDER_SITE_OTHER): Payer: Managed Care, Other (non HMO) | Admitting: Surgery

## 2012-08-05 NOTE — Telephone Encounter (Signed)
Left voicemail for patient to call back Friday to schedule  appt for Friday

## 2012-08-05 NOTE — Telephone Encounter (Signed)
Called pt's mobile phone.  The call went to VM, which was full so I could not leave a message.  I then called the pt's work phone number.  A co-worker of his answered and stated that they were not to receive "personal" calls while at work.  I stated that I was with the pt's doctor's office and that I was just trying to confirm that the pt would be able to make it to his appt today.  The co-worker stated that he would "pass the message" to the pt.

## 2012-08-07 ENCOUNTER — Ambulatory Visit: Payer: Self-pay | Admitting: *Deleted

## 2012-08-07 ENCOUNTER — Telehealth (INDEPENDENT_AMBULATORY_CARE_PROVIDER_SITE_OTHER): Payer: Self-pay

## 2012-08-07 NOTE — Telephone Encounter (Signed)
Spoke with pt about his r/s'd appt date and time: Mon march 3rd @ 11a.  Pt stated that the date works for him, just not the time.  R/s for 4p on the same day

## 2012-08-20 ENCOUNTER — Ambulatory Visit: Payer: Self-pay | Admitting: *Deleted

## 2012-08-21 ENCOUNTER — Telehealth (INDEPENDENT_AMBULATORY_CARE_PROVIDER_SITE_OTHER): Payer: Self-pay

## 2012-08-21 NOTE — Telephone Encounter (Signed)
Called pt to ask if he could r/s his appt time on 3/3 from 4p to 1230p.  LMOM asking pt to call our office to discuss

## 2012-08-24 ENCOUNTER — Encounter (INDEPENDENT_AMBULATORY_CARE_PROVIDER_SITE_OTHER): Payer: Managed Care, Other (non HMO) | Admitting: Surgery

## 2012-08-26 ENCOUNTER — Telehealth (INDEPENDENT_AMBULATORY_CARE_PROVIDER_SITE_OTHER): Payer: Self-pay

## 2012-10-30 ENCOUNTER — Encounter (INDEPENDENT_AMBULATORY_CARE_PROVIDER_SITE_OTHER): Payer: Managed Care, Other (non HMO) | Admitting: Surgery

## 2012-11-05 ENCOUNTER — Ambulatory Visit (INDEPENDENT_AMBULATORY_CARE_PROVIDER_SITE_OTHER): Payer: Managed Care, Other (non HMO) | Admitting: Surgery

## 2012-11-05 VITALS — BP 130/76 | HR 63 | Temp 99.0°F | Resp 18 | Ht 68.0 in | Wt 209.0 lb

## 2012-11-05 DIAGNOSIS — Z9884 Bariatric surgery status: Secondary | ICD-10-CM

## 2012-11-05 NOTE — Progress Notes (Signed)
Lapband Fill Encounter Problem List:   Patient Active Problem List   Diagnosis Date Noted  . Diabetes mellitus 05/02/2011  . Lapband APL + [facility] repair August 2012 02/12/2011    Lawrence Hull Body mass index is 31.79 kg/(m^2). Weight loss since surgery  75.  Having regurgitation?:  no  Feel that they need a fill?  yes  Nocturnal reflux?  no  Amount of fill  0.25   Port site: Easily accessed and filled.  Brett Canales knows what to do.  Swallowed water OK  Instructions given and weight loss goals discussed.    [name], MD, FACS

## 2012-11-12 NOTE — Telephone Encounter (Signed)
ERROR

## 2013-02-11 ENCOUNTER — Ambulatory Visit (INDEPENDENT_AMBULATORY_CARE_PROVIDER_SITE_OTHER): Payer: Managed Care, Other (non HMO) | Admitting: Surgery

## 2013-02-11 ENCOUNTER — Encounter (INDEPENDENT_AMBULATORY_CARE_PROVIDER_SITE_OTHER): Payer: Self-pay | Admitting: Surgery

## 2013-02-11 VITALS — BP 126/74 | HR 64 | Temp 97.6°F | Resp 14 | Ht 68.0 in | Wt 206.2 lb

## 2013-02-11 DIAGNOSIS — Z9884 Bariatric surgery status: Secondary | ICD-10-CM

## 2013-02-11 DIAGNOSIS — I10 Essential (primary) hypertension: Secondary | ICD-10-CM | POA: Insufficient documentation

## 2013-02-11 NOTE — Progress Notes (Signed)
Lapband Fill Encounter Problem List:   Patient Active Problem List   Diagnosis Date Noted  . Diabetes mellitus 05/02/2011  . Lapband APL + [facility] repair August 2012 02/12/2011    Remi Haggard Body mass index is 31.36 kg/(m^2). Weight loss since surgery  78.4 lbs  Having regurgitation?:  no  Feel that they need a fill?  no  Nocturnal reflux?  no  Amount of fill  0     Instructions given and weight loss goals discussed.    Doing very well with restriction.  Off DM meds since May 2013.  This month he is 2 years out from surgery.  Still on BP med.  Return 3 months.    [name], MD, FACS

## 2013-02-11 NOTE — Patient Instructions (Signed)
Thanks for your patience.  If you need further assistance after leaving the office, please call our office and speak with a [facility] nurse.  [phone].  If you want to leave a message for Dr. Jaz Mallick, please call his office phone at [phone]. 

## 2013-04-29 ENCOUNTER — Other Ambulatory Visit: Payer: Self-pay

## 2013-06-03 ENCOUNTER — Ambulatory Visit (INDEPENDENT_AMBULATORY_CARE_PROVIDER_SITE_OTHER): Payer: Managed Care, Other (non HMO) | Admitting: Surgery

## 2013-06-03 VITALS — BP 128/76 | HR 60 | Temp 98.0°F | Resp 18 | Ht 68.0 in | Wt 198.8 lb

## 2013-06-03 DIAGNOSIS — Z9884 Bariatric surgery status: Secondary | ICD-10-CM

## 2013-06-03 NOTE — Progress Notes (Signed)
Lapband Fill Encounter Problem List:   Patient Active Problem List   Diagnosis Date Noted  . Unspecified essential hypertension 02/11/2013  . Lapband APL + [facility] repair August 2012 02/12/2011    Lawrence Hull Body mass index is 30.23 kg/(m^2). Weight loss since surgery  Over 100 lbs on his records  Having regurgitation?:  no  Feel that they need a fill?  no  Nocturnal reflux?  no  Amount of fill  0     Instructions given and weight loss goals discussed.    [name] is in the green zone.  He went on a cruise for their anniversary and it would take him 45 min to eat.  He has lost an additional 7 lbs.    [name], MD, FACS

## 2013-06-03 NOTE — Patient Instructions (Signed)
You are doing a great job.  Keep it up!

## 2013-09-09 ENCOUNTER — Ambulatory Visit (INDEPENDENT_AMBULATORY_CARE_PROVIDER_SITE_OTHER): Payer: Managed Care, Other (non HMO) | Admitting: Surgery

## 2013-09-09 ENCOUNTER — Encounter (INDEPENDENT_AMBULATORY_CARE_PROVIDER_SITE_OTHER): Payer: Self-pay | Admitting: Surgery

## 2013-09-09 VITALS — BP 126/80 | HR 75 | Temp 98.2°F | Resp 16 | Ht 68.0 in | Wt 208.0 lb

## 2013-09-09 DIAGNOSIS — Z9884 Bariatric surgery status: Secondary | ICD-10-CM

## 2013-09-09 NOTE — Patient Instructions (Signed)

## 2013-09-09 NOTE — Progress Notes (Signed)
Lapband Fill Encounter Problem List:   Patient Active Problem List   Diagnosis Date Noted  . Unspecified essential hypertension 02/11/2013  . [facility]band [facility]PL [facility] August 2012 02/12/2011    Remi HaggardSteven R Casco Body mass index is 31.63 kg/(m^2). Weight loss since surgery  76.6  Having regurgitation?:  no  Feel that they need a fill?  no  Nocturnal reflux?  no  Amount of fill  0     Instructions given and weight loss goals discussed.    Having symptoms of dysphagia initially and then being able to eat ad lib.  Has gained 10 lbs.  Will get UGI to check for anterior slip.   Matt B. Daphine DeutscherMartin, MD, FACS

## 2013-09-15 ENCOUNTER — Other Ambulatory Visit (INDEPENDENT_AMBULATORY_CARE_PROVIDER_SITE_OTHER): Payer: Self-pay | Admitting: Surgery

## 2013-09-15 ENCOUNTER — Ambulatory Visit
Admission: RE | Admit: 2013-09-15 | Discharge: 2013-09-15 | Disposition: A | Discharge status: Home / Self Care | Payer: Managed Care, Other (non HMO) | Source: Ambulatory Visit | Attending: Surgery | Admitting: Surgery

## 2013-09-15 DIAGNOSIS — Z9884 Bariatric surgery status: Secondary | ICD-10-CM

## 2017-07-23 ENCOUNTER — Ambulatory Visit (INDEPENDENT_AMBULATORY_CARE_PROVIDER_SITE_OTHER): Payer: 59 | Admitting: Neurology

## 2017-07-23 ENCOUNTER — Encounter: Payer: Self-pay | Admitting: Neurology

## 2017-07-23 VITALS — BP 141/89 | HR 66 | Ht 67.0 in | Wt 236.0 lb

## 2017-07-23 DIAGNOSIS — G250 Essential tremor: Secondary | ICD-10-CM | POA: Diagnosis not present

## 2017-07-23 NOTE — Progress Notes (Signed)
Subjective:    Patient ID: Lawrence Hull is a 47 y.o. male.  HPI     Lawrence Foley, MD, PhD [facility] [address], [address] [address] [place], [place] [address]  Dear Dr. Welton Flakes,   I saw your patient, Lawrence Hull, upon your kind request, in my clinic today for initial consultation of his tremors. The patient is unaccompanied today. As you know, Lawrence Hull is a 47 year old right-handed gentleman with an underlying medical history of reflux disease, hypertension, OSA, type 2 diabetes, obesity, status post weight loss surgery, and hyperlipidemia, who reports a longer standing history of bilateral upper extremity tremors of at least 10 years duration. Symptoms have progressed and are more noticeable now, left side is worse than right. I reviewed your office note from 05/30/2017, which you kindly included. He had some blood work in your office at the time which I reviewed his well. BMP was unremarkable with the exception of borderline glucose level at 125, CBC with differential was 9, hemoglobin A1c was 6.3, TSH 0.884, ALT normal at 22.   He was advised to limit his caffeine, you consider reduction in his Lexapro dose, he was already on a beta blocker for his blood pressure at the time. He had a CTH through [facility] some 1 1/2 years ago, as he had a fall and hit his head, had gotten out of the tub after 30 min, had diarrhea, was deemed to have dehydration, fell in the bathroom.  He has not noticed any other neurological symptoms such as one-sided numbness, or tingling, weakness, does not always have great balance however. He did not have recurrent headaches. He does not have any family history of tremors but does not know much about his 3 paternal half siblings. He has 1 biological brother does not have a tremor. He does not have any biological children, he lives with his wife and stepdaughter and her 40-year-old child. He has not had any recent falls, he  noticed a recent exacerbation of his left hand trembling at work. Otherwise, he is not impaired by the tremor, it does not always bother him, most days he is not very aware of it. He had another incident at work where his left hand was more shaky than usual. He does not have any major stressors in life. He does not smoke, does not typically drink any alcohol, does like caffeine in the form of soda, typically 24 ounce bottle per day, does try to hydrate well with water, does not drink tea or coffee and some hot chocolate in the morning occasionally. His weight has recently been stable. He was able to lose weight after his lap band surgery in 2012. He's been on Lexapro for over a year, he has been on atenolol for over 20 years. He was able to reduce the dose after his weight loss. He carries a diagnosis of obstructive sleep apnea but had nasal surgery, tonsillectomy as well as uvulectomy in the past, this was before his weight loss surgery.  His Past Medical History Is Significant For: Past Medical History:  Diagnosis Date  . Diabetes mellitus   . GERD (gastroesophageal reflux disease)   . Hyperlipidemia   . Hypertension     Her Past Surgical History Is Significant For: Past Surgical History:  Procedure Laterality Date  . FRACTURE SURGERY     left x2  . LAPAROSCOPIC GASTRIC BANDING  01/29/11  . NASAL SEPTUM SURGERY    . TONSILECTOMY, ADENOIDECTOMY, BILATERAL MYRINGOTOMY  AND TUBES      His Family History Is Significant For: Family History  Problem Relation Age of Onset  . Cancer Mother        lung  . Heart disease Mother   . Cancer Father        prostate    His Social History Is Significant For: Social History   Socioeconomic History  . Marital status: Married    Spouse name: None  . Number of children: None  . Years of education: None  . Highest education level: None  Social Needs  . Financial resource strain: None  . Food insecurity - worry: None  . Food insecurity -  inability: None  . Transportation needs - medical: None  . Transportation needs - non-medical: None  Occupational History  . None  Tobacco Use  . Smoking status: Never Smoker  . Smokeless tobacco: Never Used  Substance and Sexual Activity  . Alcohol use: No  . Drug use: No  . Sexual activity: None  Other Topics Concern  . None  Social History Narrative  . None    His Allergies Are:  No Known Allergies:   His Current Medications Are:  Outpatient Encounter Medications as of 07/23/2017  Medication Sig  . atenolol (TENORMIN) 50 MG tablet Take 50 mg by mouth daily.  Marland Kitchen. atorvastatin (LIPITOR) 10 MG tablet Take 10 mg by mouth daily.  . cyclobenzaprine (FLEXERIL) 10 MG tablet Take 5-10 mg by mouth at bedtime.  Marland Kitchen. escitalopram (LEXAPRO) 20 MG tablet Take 20 mg by mouth daily.  . lansoprazole (PREVACID) 15 MG capsule Take 15 mg by mouth daily at 12 noon.  Marland Kitchen. lisinopril (PRINIVIL,ZESTRIL) 10 MG tablet Take 10 mg by mouth daily.    . Multiple Vitamins-Minerals (MULTIVITAMIN WITH MINERALS) tablet Take 1 tablet by mouth daily.    . ranitidine (ZANTAC) 150 MG capsule Take 150 mg by mouth 2 (two) times daily.  . [DISCONTINUED] Melatonin 1 MG CAPS Take 3 mg by mouth.  . [DISCONTINUED] polyethylene glycol (MIRALAX / GLYCOLAX) packet Take 17 g by mouth daily.   No facility-administered encounter medications on file as of 07/23/2017.   :   Review of Systems:  Out of a complete 14 point review of systems, all are reviewed and negative with the exception of these symptoms as listed below: Review of Systems  Neurological:       Pt presents today to discuss his tremors. Pt's left hand is more tremulous than his right.     Objective:  Neurological Exam  Physical Exam Physical Examination:   Vitals:   07/23/17 0945  BP: (!) 141/89  Pulse: 66    General Examination: The patient is a very pleasant 47 y.o. male in no acute distress. He appears well-developed and well-nourished and well  groomed.   HEENT: Normocephalic, atraumatic, pupils are equal, round and reactive to light and accommodation. Funduscopic exam is normal with sharp disc margins noted. He wears corrective eyeglasses. Extraocular tracking is good without limitation to gaze excursion or nystagmus noted. Normal smooth pursuit is noted. Hearing is grossly intact. Face is symmetric with normal facial animation and normal facial sensation. Speech is clear with no dysarthria noted. There is no hypophonia. There is no lip, neck/head, jaw or voice tremor. Neck is supple with full range of passive and active motion. There are no carotid bruits on auscultation. Oropharynx exam reveals: mild mouth dryness, adequate dental hygiene and mild airway crowding, due to smaller airway entry, status post uvulectomy,  status post tonsillectomy, Mallampati is class I. Tongue protrudes centrally and palate elevates symmetrically.  Chest: Clear to auscultation without wheezing, rhonchi or crackles noted.  Heart: S1+S2+0, regular and normal without murmurs, rubs or gallops noted.   Abdomen: Soft, non-tender and non-distended with normal bowel sounds appreciated on auscultation.  Extremities: There is no pitting edema in the distal lower extremities bilaterally. Pedal pulses are intact.  Skin: Warm and dry without trophic changes noted.  Musculoskeletal: exam reveals no obvious joint deformities, tenderness or joint swelling or erythema with the exception of decreased range of motion in the left ankle. He had left ankle surgery.   Neurologically:  Mental status: The patient is awake, alert and oriented in all 4 spheres. His immediate and remote memory, attention, language skills and fund of knowledge are appropriate. There is no evidence of aphasia, agnosia, apraxia or anomia. Speech is clear with normal prosody and enunciation. Thought process is linear. Mood is normal and affect is normal.  Cranial nerves II - XII are as described above  under HEENT exam. In addition: shoulder shrug is normal with equal shoulder height noted. Motor exam: Normal bulk, strength and tone is noted. There is no drift, or rebound. He has a minimal right hand and mild left hand postural and action tremor. He has no intention tremor. He has no resting tremor. Minimal cocontraction is noted of the left hand with activation of the right hand.   On 07/23/2017: On Archimedes spiral drawing there is mild tremulousness noted, left more than right, handwriting is not particularly tremulous, legible, not micrographic.   Romberg is negative. Reflexes are 3+ throughout. Fine motor skills and coordination: intact with normal finger taps, normal hand movements, normal rapid alternating patting, normal foot taps and normal foot agility.  Cerebellar testing: No dysmetria or intention tremor on finger to nose testing. Heel to shin is unremarkable bilaterally. There is no truncal or gait ataxia.  Sensory exam: intact to light touch in the UEs and LEs.  Gait, station and balance: He stands easily. No veering to one side is noted. No leaning to one side is noted. Posture is age-appropriate and stance is narrow based. Gait shows normal stride length and normal pace. No problems turning are noted. Tandem walk is unremarkable.  Assessment and plan:    In summary, DAMEER SPEISER is a very pleasant 47 y.o.-year old male with an underlying medical history of reflux disease, hypertension, OSA, type 2 diabetes, obesity, status post weight loss surgery, and hyperlipidemia, who presents for evaluation of his hand tremors. On examination, he has a mild left more than right postural and action tremor. His history and exam are most likely in keeping with essential tremor. Thankfully, he does not have any other sinister symptoms and his exam is not supportive of parkinsonism. He is reassured in that regard. His tremor is rather mild and I would not recommend any symptomatic medication quite  yet. He is already on a beta blocker and we may consider a trial of Mysoline down the road. He had a head CT in the recent past without any acute findings per his report. I suggested we monitor his symptoms and his exam. He had recent blood work through your office which I reviewed. I suggested patient continue to monitor his symptoms and limit his caffeine intake, stay well-hydrated with water, call us if he has any interim questions or concerns, otherwise I will see him back in about 6 months. I answered all his questions today  and he was in agreement.  Thank you very much for allowing me to participate in the care of this nice patient. If I can be of any further assistance to you please do not hesitate to call me at [phone].  Sincerely,   Lawrence Foley, MD, PhD

## 2017-07-23 NOTE — Patient Instructions (Addendum)
Thankfully, I do not see any signs or symptoms of parkinson's like disease or what we call parkinsonism.   You do have a mild appearing tremor affecting both hands, left more than right. For this, I would not recommend any new medication quite yet, as there are only a few medications we can try and none without potential side effects or medication interaction. We will monitor things, your exam, other than the tremor is good, which of course is reassuring. We will do a 6 month follow up.   Remember to drink plenty of fluid at least 6 glasses (8 oz each), eat healthy meals and do not skip any meals. Try to eat protein with a every meal and eat a healthy snack such as fruit or nuts in between meals. Try to keep a regular sleep-wake schedule and try to exercise daily, particularly in the form of walking, 20-30 minutes a day, if you can.   You can email me through my chart and also leave a phone message for Lawrence Hull, my nurse.   Please remember, that any kind of tremor may be exacerbated by anxiety, anger, nervousness, excitement, dehydration, sleep deprivation, by caffeine, and low blood sugar values or blood sugar fluctuations. Some medications, especially some antidepressants and lithium can cause or exacerbate tremors. Tremors may temporarily calm down or subside with the use of a benzodiazepine such as Valium or related medications and with alcohol.

## 2017-09-05 ENCOUNTER — Encounter (HOSPITAL_COMMUNITY): Payer: Self-pay

## 2018-01-20 ENCOUNTER — Encounter: Payer: Self-pay | Admitting: Neurology

## 2018-01-20 ENCOUNTER — Ambulatory Visit (INDEPENDENT_AMBULATORY_CARE_PROVIDER_SITE_OTHER): Payer: 59 | Admitting: Neurology

## 2018-01-20 VITALS — BP 140/91 | HR 78 | Ht 67.0 in | Wt 235.0 lb

## 2018-01-20 DIAGNOSIS — G250 Essential tremor: Secondary | ICD-10-CM | POA: Diagnosis not present

## 2018-01-20 DIAGNOSIS — H53452 Other localized visual field defect, left eye: Secondary | ICD-10-CM

## 2018-01-20 NOTE — Patient Instructions (Signed)
For your tremor and visual disturbance in the L eye, I will request a brain scan, called MRI and call you with the test results. We will have to schedule you for this on a separate date. This test requires authorization from your insurance, and we will take care of the insurance process.  You may truly have symptoms of glaucoma.

## 2018-01-20 NOTE — Progress Notes (Signed)
Subjective:    Patient ID: Lawrence Hull is a 47 y.o. male.  HPI     Interim history:   Lawrence Hull is a 47 year old right-handed gentleman with an underlying medical history of reflux disease, hypertension, OSA, type 2 diabetes, obesity, status post weight loss surgery, and hyperlipidemia, who presents for follow-up consultation of his hand tremors. The patient is unaccompanied today. I first met him on 07/23/2017 at the request of his primary care physician, at which time he reported a several year history of bilateral upper extremity tremors, left more than right. Findings were on the mild side and mostly in keeping with essential tremor for which I did not suggest any new medications. Patient was on a beta blocker at the time.   Today, 01/20/2018: He reports feeling stable, as far as his tremors concerned. He has noticed for the past 5 months or so issues with his left eye including halos around light sources and especially when he drives at night. He does not have any blurry vision, no migraine aura type description. He has seen his optometrist several times, he was told that his pressure in the left eye is just a little bit higher and was recently started about 2 weeks ago on glaucoma eyedrops. He denies any other new neurological symptoms or headaches. He has a history of significant eye problems since he was a child. He needed eyeglasses because of very weak right eye and severe astigmatism. He got his first set of eyeglasses when he was in third grade.  The patient's allergies, current medications, family history, past medical history, past social history, past surgical history and problem list were reviewed and updated as appropriate.   Previously:   07/23/2017: (He) reports a longer standing history of bilateral upper extremity tremors of at least 10 years duration. Symptoms have progressed and are more noticeable now, left side is worse than right. I reviewed your office note  from 05/30/2017, which you kindly included. He had some blood work in your office at the time which I reviewed his well. BMP was unremarkable with the exception of borderline glucose level at 125, CBC with differential was 9, hemoglobin A1c was 6.3, TSH 0.884, ALT normal at 22.   He was advised to limit his caffeine, you consider reduction in his Lexapro dose, he was already on a beta blocker for his blood pressure at the time. He had a CTH through [facility] some 1 1/2 years ago, as he had a fall and hit his head, had gotten out of the tub after 30 min, had diarrhea, was deemed to have dehydration, fell in the bathroom.  He has not noticed any other neurological symptoms such as one-sided numbness, or tingling, weakness, does not always have great balance however. He did not have recurrent headaches. He does not have any family history of tremors but does not know much about his 3 paternal half siblings. He has 1 biological brother does not have a tremor. He does not have any biological children, he lives with his wife and stepdaughter and her 29-year-old child. He has not had any recent falls, he noticed a recent exacerbation of his left hand trembling at work. Otherwise, he is not impaired by the tremor, it does not always bother him, most days he is not very aware of it. He had another incident at work where his left hand was more shaky than usual. He does not have any major stressors in life. He does not smoke, does  not typically drink any alcohol, does like caffeine in the form of soda, typically 24 ounce bottle per day, does try to hydrate well with water, does not drink tea or coffee and some hot chocolate in the morning occasionally. His weight has recently been stable. He was able to lose weight after his lap band surgery in 2012. He's been on Lexapro for over a year, he has been on atenolol for over 20 years. He was able to reduce the dose after his weight loss. He carries a diagnosis of  obstructive sleep apnea but had nasal surgery, tonsillectomy as well as uvulectomy in the past, this was before his weight loss surgery.  His Past Medical History Is Significant For: Past Medical History:  Diagnosis Date  . Diabetes mellitus   . GERD (gastroesophageal reflux disease)   . Hyperlipidemia   . Hypertension     His Past Surgical History Is Significant For: Past Surgical History:  Procedure Laterality Date  . FRACTURE SURGERY     left x2  . LAPAROSCOPIC GASTRIC BANDING  01/29/11  . NASAL SEPTUM SURGERY    . TONSILECTOMY, ADENOIDECTOMY, BILATERAL MYRINGOTOMY AND TUBES      His Family History Is Significant For: Family History  Problem Relation Age of Onset  . Cancer Mother        lung  . Heart disease Mother   . Cancer Father        prostate    His Social History Is Significant For: Social History   Socioeconomic History  . Marital status: Married    Spouse name: Not on file  . Number of children: Not on file  . Years of education: Not on file  . Highest education level: Not on file  Occupational History  . Not on file  Social Needs  . Financial resource strain: Not on file  . Food insecurity:    Worry: Not on file    Inability: Not on file  . Transportation needs:    Medical: Not on file    Non-medical: Not on file  Tobacco Use  . Smoking status: Never Smoker  . Smokeless tobacco: Never Used  Substance and Sexual Activity  . Alcohol use: No  . Drug use: No  . Sexual activity: Not on file  Lifestyle  . Physical activity:    Days per week: Not on file    Minutes per session: Not on file  . Stress: Not on file  Relationships  . Social connections:    Talks on phone: Not on file    Gets together: Not on file    Attends religious service: Not on file    Active member of club or organization: Not on file    Attends meetings of clubs or organizations: Not on file    Relationship status: Not on file  Other Topics Concern  . Not on file  Social  History Narrative  . Not on file    His Allergies Are:  No Known Allergies:   His Current Medications Are:  Outpatient Encounter Medications as of 01/20/2018  Medication Sig  . atenolol (TENORMIN) 50 MG tablet Take 50 mg by mouth daily.  Marland Kitchen atorvastatin (LIPITOR) 10 MG tablet Take 10 mg by mouth daily.  . lansoprazole (PREVACID) 15 MG capsule Take 15 mg by mouth daily at 12 noon.  Marland Kitchen lisinopril (PRINIVIL,ZESTRIL) 10 MG tablet Take 10 mg by mouth daily.    . Multiple Vitamins-Minerals (MULTIVITAMIN WITH MINERALS) tablet Take 1 tablet by mouth  daily.    . ranitidine (ZANTAC) 150 MG capsule Take 150 mg by mouth 2 (two) times daily.  Marland Kitchen venlafaxine XR (EFFEXOR-XR) 150 MG 24 hr capsule Take 150 mg by mouth daily.  . [DISCONTINUED] cyclobenzaprine (FLEXERIL) 10 MG tablet Take 5-10 mg by mouth at bedtime.  . [DISCONTINUED] escitalopram (LEXAPRO) 20 MG tablet Take 20 mg by mouth daily.   No facility-administered encounter medications on file as of 01/20/2018.   :  Review of Systems:  Out of a complete 14 point review of systems, all are reviewed and negative with the exception of these symptoms as listed below: Review of Systems  Neurological:       Pt presents today to discuss his tremors. Pt reports that his tremor is stable. Pt is having visual disturbances in his left eye. Pt has seen his eye doctor but they could not find a reason for the disturbances.    Objective:  Neurological Exam  Physical Exam Physical Examination:   Vitals:   01/20/18 1256  BP: (!) 140/91  Pulse: 78   General Examination: The patient is a very pleasant 47 y.o. male in no acute distress. He appears well-developed and well-nourished and well groomed.   HEENT: Normocephalic, atraumatic, pupils are equal, round and reactive to light and accommodation. He wears corrective eyeglasses. Extraocular tracking is good without limitation to gaze excursion or nystagmus noted. Normal smooth pursuit is noted. Hearing is  grossly intact. Face is symmetric with normal facial animation and normal facial sensation. Speech is clear with no dysarthria noted. There is no hypophonia. There is no lip, neck/head, jaw or voice tremor. Neck is supple with full range of passive and active motion. Oropharynx exam reveals: no new finding. There is slightly pressured speech.  Chest: Clear to auscultation without wheezing, rhonchi or crackles noted.  Heart: S1+S2+0, regular and normal without murmurs, rubs or gallops noted.   Abdomen: Soft, non-tender and non-distended with normal bowel sounds appreciated on auscultation.  Extremities: There is no pitting edema in the distal lower extremities bilaterally. Pedal pulses are intact.  Skin: Warm and dry without trophic changes noted.  Musculoskeletal: exam reveals no obvious joint deformities, tenderness or joint swelling or erythema with the exception of decreased range of motion in the left ankle. He had left ankle surgery.   Neurologically:  Mental status: The patient is awake, alert and oriented in all 4 spheres. His immediate and remote memory, attention, language skills and fund of knowledge are appropriate. There is no evidence of aphasia, agnosia, apraxia or anomia. Speech is clear with normal prosody and enunciation. Thought process is linear. Mood is normal and affect is normal.  Cranial nerves II - XII are as described above under HEENT exam. In addition: shoulder shrug is normal with equal shoulder height noted. Motor exam: Normal bulk, strength and tone is noted. There is no drift.   (On 07/23/2017: On Archimedes spiral drawing there is mild tremulousness noted, left more than right, handwriting is not particularly tremulous, legible, not micrographic)  He she has a fairly stable appearing bilateral postural and action tremor, left more than right. He has no resting tremor.    Romberg is negative. Reflexes are 3+ throughout. Fine motor skills and coordination:  intact with normal finger taps, normal hand movements, normal rapid alternating patting, normal foot taps and normal foot agility.  Cerebellar testing: No dysmetria or intention tremor. There is no truncal or gait ataxia.  Sensory exam: intact to light touch in the UEs and  LEs.  Gait, station and balance: He stands easily. No veering to one side is noted. No leaning to one side is noted. Posture is age-appropriate and stance is narrow based. Gait shows normal stride length and normal pace. No problems turning are noted. Tandem walk is unremarkable.  Assessment and plan:    In summary, Lawrence Hull is a very pleasant 47 year old male with an underlying medical history of reflux disease, hypertension, OSA, type 2 diabetes, obesity, status post weight loss surgery, and hyperlipidemia, who presents for follow up consultation his hand tremors. On examination, he has a mild left more than right postural and action tremor, findings are stable compared to 6 months ago. 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 He is no longer on Lexapro, he is now on long-acting Effexor. We mutually agreed to continue to monitor his symptoms. He is on low-dose atenolol. I may consider a trial of low-dose Mysoline down the road. He reports recent issues with blurry vision in the left eye as well as halo around lights sources. He was recently started on glaucoma eyedrops by his optometrist and his symptoms may very well be related to beginning glaucoma. I do not see any other neurological focality on exam and tremor is stable. Nevertheless, I suggested we proceed with a brain MRI with and without contrast to rule out any other structural cause of his visual symptoms and his tremor at this point. We will call him with his MRI results, we will do blood work today to check on his kidney function and we will call him if there is any problem with a CMP. I suggested a routine follow-up in 6 months, sooner if needed. I  answered all his questions today and he was in agreement. I spent 25 minutes in total face-to-face time with the patient, more than 50% of which was spent in counseling and coordination of care, reviewing test results, reviewing medication and discussing or reviewing the diagnosis of ET,  its prognosis and treatment options. Pertinent laboratory and imaging test results that were available during this visit with the patient were reviewed by me and considered in my medical decision making (see chart for details).

## 2018-01-21 ENCOUNTER — Telehealth: Payer: Self-pay

## 2018-01-21 LAB — COMPREHENSIVE METABOLIC PANEL
A/G RATIO: 2 (ref 1.2–2.2)
ALT: 19 IU/L (ref 0–44)
AST: 16 IU/L (ref 0–40)
Albumin: 4.5 g/dL (ref 3.5–5.5)
Alkaline Phosphatase: 106 IU/L (ref 39–117)
BUN/Creatinine Ratio: 13 (ref 9–20)
BUN: 13 mg/dL (ref 6–24)
Bilirubin Total: 0.4 mg/dL (ref 0.0–1.2)
CALCIUM: 9.4 mg/dL (ref 8.7–10.2)
CO2: 23 mmol/L (ref 20–29)
Chloride: 100 mmol/L (ref 96–106)
Creatinine, Ser: 1.01 mg/dL (ref 0.76–1.27)
GFR, EST AFRICAN AMERICAN: 102 mL/min/{1.73_m2} (ref >59)
GFR, EST NON AFRICAN AMERICAN: 88 mL/min/{1.73_m2} (ref >59)
GLOBULIN, TOTAL: 2.2 g/dL (ref 1.5–4.5)
Glucose: 130 mg/dL — ABNORMAL HIGH (ref 65–99)
Potassium: 4.4 mmol/L (ref 3.5–5.2)
SODIUM: 137 mmol/L (ref 134–144)
TOTAL PROTEIN: 6.7 g/dL (ref 6.0–8.5)

## 2018-01-21 NOTE — Telephone Encounter (Signed)
-----   Message from Huston FoleySaima Athar, MD sent at 01/21/2018  7:41 AM EDT ----- Blood test fine, in particular kidney and liver function in preparation for MRI. Blood sugar was a little high at 130, but was not a fasting test. Please update pt.  [name]

## 2018-01-21 NOTE — Telephone Encounter (Signed)
I called pt, no answer, left a detailed message on his cell phone, per DPR, advising him of his lab results and asking him to call me back if he has any questions or concerns.

## 2018-01-21 NOTE — Progress Notes (Signed)
Blood test fine, in particular kidney and liver function in preparation for MRI. Blood sugar was a little high at 130, but was not a fasting test. Please update pt.  Lawrence Hull

## 2018-02-12 ENCOUNTER — Other Ambulatory Visit: Payer: Self-pay

## 2018-02-13 ENCOUNTER — Ambulatory Visit
Admission: RE | Admit: 2018-02-13 | Discharge: 2018-02-13 | Disposition: A | Discharge status: Home / Self Care | Payer: 59 | Source: Ambulatory Visit | Attending: Neurology | Admitting: Neurology

## 2018-02-13 DIAGNOSIS — H53452 Other localized visual field defect, left eye: Secondary | ICD-10-CM

## 2018-02-13 DIAGNOSIS — G250 Essential tremor: Secondary | ICD-10-CM

## 2018-02-13 MED ORDER — GADOBENATE DIMEGLUMINE 529 MG/ML IV SOLN
20.0000 mL | Freq: Once | INTRAVENOUS | Status: AC | PRN
  Administered 2018-02-13: 20 mL via INTRAVENOUS

## 2018-02-16 ENCOUNTER — Telehealth: Payer: Self-pay

## 2018-02-16 NOTE — Progress Notes (Signed)
MRI brain was reported as normal for age. Please notify pt.  [name]

## 2018-02-16 NOTE — Telephone Encounter (Signed)
-----   Message from [name], Huston FoleyMD sent at 02/16/2018  7:37 AM EDT ----- MRI brain was reported as normal for age. Please notify pt.  Lawrence Harveysa

## 2018-02-16 NOTE — Telephone Encounter (Signed)
I called pt , left a detailed message on his cell phone, per DPR, advising him that his MRI was reported as normal for age. I asked pt to call us back with any questions or concerns.

## 2018-07-23 ENCOUNTER — Ambulatory Visit: Payer: 59 | Admitting: Neurology
